# Patient Record
Sex: Female | Born: 1980 | Race: White | Hispanic: No | Marital: Married | Smoking: Never smoker
Health system: Southern US, Community
[De-identification: ages and names within clinical notes are randomized; demographics above are authoritative.]

## PROBLEM LIST (undated history)

## (undated) ENCOUNTER — Inpatient Hospital Stay (HOSPITAL_COMMUNITY): Payer: Self-pay

## (undated) DIAGNOSIS — K635 Polyp of colon: Secondary | ICD-10-CM

## (undated) DIAGNOSIS — E785 Hyperlipidemia, unspecified: Secondary | ICD-10-CM

## (undated) HISTORY — DX: Hyperlipidemia, unspecified: E78.5

## (undated) HISTORY — DX: Polyp of colon: K63.5

---

## 2002-02-04 HISTORY — PX: WISDOM TOOTH EXTRACTION: SHX21

## 2005-10-11 ENCOUNTER — Other Ambulatory Visit: Admission: RE | Admit: 2005-10-11 | Discharge: 2005-10-11 | Payer: Self-pay | Admitting: Family Medicine

## 2007-01-15 ENCOUNTER — Other Ambulatory Visit: Admission: RE | Admit: 2007-01-15 | Discharge: 2007-01-15 | Payer: Self-pay | Admitting: Family Medicine

## 2010-03-07 ENCOUNTER — Inpatient Hospital Stay (HOSPITAL_COMMUNITY): Admission: AD | Admit: 2010-03-07 | Payer: Self-pay | Admitting: Obstetrics & Gynecology

## 2010-04-02 ENCOUNTER — Inpatient Hospital Stay (HOSPITAL_COMMUNITY)
Admission: RE | Admit: 2010-04-02 | Discharge: 2010-04-05 | DRG: 373 | Disposition: A | Discharge status: Home / Self Care | Payer: BC Managed Care – PPO | Source: Ambulatory Visit | Attending: Obstetrics and Gynecology | Admitting: Obstetrics and Gynecology

## 2010-04-02 DIAGNOSIS — O99892 Other specified diseases and conditions complicating childbirth: Secondary | ICD-10-CM | POA: Diagnosis present

## 2010-04-02 DIAGNOSIS — D649 Anemia, unspecified: Secondary | ICD-10-CM | POA: Diagnosis not present

## 2010-04-02 DIAGNOSIS — O9903 Anemia complicating the puerperium: Secondary | ICD-10-CM | POA: Diagnosis not present

## 2010-04-02 DIAGNOSIS — Z22330 Carrier of Group B streptococcus: Secondary | ICD-10-CM

## 2010-04-02 LAB — CBC
HCT: 34.2 % — ABNORMAL LOW (ref 36.0–46.0)
Hemoglobin: 11.6 g/dL — ABNORMAL LOW (ref 12.0–15.0)
RBC: 3.73 MIL/uL — ABNORMAL LOW (ref 3.87–5.11)
RDW: 13 % (ref 11.5–15.5)
WBC: 14.4 10*3/uL — ABNORMAL HIGH (ref 4.0–10.5)

## 2010-04-04 LAB — CBC
HCT: 18.6 % — ABNORMAL LOW (ref 36.0–46.0)
Hemoglobin: 6.2 g/dL — CL (ref 12.0–15.0)
MCV: 93 fL (ref 78.0–100.0)
Platelets: 207 10*3/uL (ref 150–400)
RBC: 2 MIL/uL — ABNORMAL LOW (ref 3.87–5.11)
WBC: 18.9 10*3/uL — ABNORMAL HIGH (ref 4.0–10.5)

## 2010-04-05 ENCOUNTER — Inpatient Hospital Stay (HOSPITAL_COMMUNITY): Admission: AD | Admit: 2010-04-05 | Payer: Self-pay | Source: Ambulatory Visit | Admitting: Obstetrics and Gynecology

## 2010-04-05 LAB — CBC
HCT: 15.9 % — ABNORMAL LOW (ref 36.0–46.0)
Hemoglobin: 5.3 g/dL — CL (ref 12.0–15.0)
MCHC: 33.3 g/dL (ref 30.0–36.0)
MCV: 93.5 fL (ref 78.0–100.0)

## 2010-04-06 NOTE — Discharge Summary (Signed)
  [name], Carolyn Hill                  ACCOUNT NO.:  [id number]  MEDICAL RECORD NO.:  [id number]           PATIENT TYPE:  I  LOCATION:  9126                          FACILITY:  [facility]  PHYSICIAN:  Gerrit Friends. Aldona Bar, M.D.   DATE OF BIRTH:  01-30-1981  DATE OF ADMISSION:  04/02/2010 DATE OF DISCHARGE:  04/05/2010                              DISCHARGE SUMMARY   DISCHARGE DIAGNOSES: 1. A 41-week intrauterine pregnancy, delivered 7 pounds 9 ounces female     infant, Apgars 9 and 9. 2. Blood type O+. 3. Induction of labor. 4. Positive group B strep antenatally.  PROCEDURES: 1. Induction of labor. 2. Low forceps delivery. 3. Repair of third-degree tear.  SUMMARY:  The patient is a primigravida with a due date of March 24, 2010, who was admitted for induction on the morning of April 02, 2010.  Her group B strep was positive antenatally, and she was treated with intrapartum antibiotics.  She progressed and ultimately had low forceps delivery of a viable 7 pounds 9 ounces female infant with Apgars of 9 and 9 over a third-degree tear which was repaired without difficulty.  Forceps were used because she had difficulty with pushing and pushed for almost 3 hours.  Her postpartum course was uncomplicated with exception of anemia.  Her hemoglobin on the morning of April 05, 2010, was 5.3 with a white count of 13,900 and platelet count of 161,000.  In spite of her hemoglobin of 5.3, she was ambulating well, tolerating a regular diet well, having normal bowel and bladder function, was breast-feeding without difficulty, and was desirous of discharge.  Her uterus was firm.  She was having minimal bleeding.  She was given all appropriate instructions per discharge brochure and cautioned over a sudden changes in position because of her low hemoglobin.  Discharge medications include vitamins one a day as long she is breast-feeding, Feosol capsules with equivalent thereof - one daily, and she was given  prescriptions for Motrin 600 mg to use every 6 hours as needed for cramping or mild pain and Tylox one to two every 4-6 hours as needed for more severe pain.  She will return to the office for followup in approximately four weeks' time or as needed.  She was given an instruction discharge brochure at the time of discharge and understood all instructions well.  CONDITION ON DISCHARGE:  Improved.    Gerrit Friends. Aldona Bar, M.D.    RMW/MEDQ  D:  04/05/2010  T:  04/05/2010  Job:  [id number]  Electronically Signed by Annamaria Helling M.D. on 04/06/2010 09:13:40 AM

## 2012-11-04 LAB — OB RESULTS CONSOLE ABO/RH: RH TYPE: POSITIVE

## 2012-11-04 LAB — OB RESULTS CONSOLE GC/CHLAMYDIA
CHLAMYDIA, DNA PROBE: NEGATIVE
Gonorrhea: NEGATIVE

## 2012-11-04 LAB — OB RESULTS CONSOLE HEPATITIS B SURFACE ANTIGEN: HEP B S AG: NEGATIVE

## 2012-11-04 LAB — OB RESULTS CONSOLE HIV ANTIBODY (ROUTINE TESTING): HIV: NONREACTIVE

## 2012-11-04 LAB — OB RESULTS CONSOLE ANTIBODY SCREEN: ANTIBODY SCREEN: NEGATIVE

## 2012-11-04 LAB — OB RESULTS CONSOLE RUBELLA ANTIBODY, IGM: Rubella: IMMUNE

## 2012-11-04 LAB — OB RESULTS CONSOLE RPR: RPR: NONREACTIVE

## 2012-11-17 ENCOUNTER — Other Ambulatory Visit (HOSPITAL_COMMUNITY): Payer: Self-pay | Admitting: Obstetrics and Gynecology

## 2012-11-17 ENCOUNTER — Ambulatory Visit (HOSPITAL_COMMUNITY)
Admission: RE | Admit: 2012-11-17 | Discharge: 2012-11-17 | Disposition: A | Discharge status: Home / Self Care | Payer: BC Managed Care – PPO | Source: Ambulatory Visit | Attending: Obstetrics and Gynecology | Admitting: Obstetrics and Gynecology

## 2012-11-17 DIAGNOSIS — O200 Threatened abortion: Secondary | ICD-10-CM

## 2013-02-04 NOTE — L&D Delivery Note (Addendum)
Delivery Note At 7:02 AM a viable and healthy female was delivered via Vaginal, Spontaneous Delivery (Presentation: Left Occiput Anterior).  APGAR: 9, 9; weight pending .   Placenta status: Intact, Spontaneous.  Cord: 3 vessels with a loose nuchal  Pt delivered the infants head with 2 contractions. THe body quickly followed and the infant was passed to the waiting maternal abdomen. Placenta delivered spontaneously. Pt had a large gush of blood and the uterus was boggy. Methergine was called for. After placental delivery the patient began feeling nauseated, lightheaded, and dizzy. Her BP was 65/35 c/w a vasovagal reaction. Pt received methergine and some phenylephrine. She vomited adn her BP improved and she felt better. A second degree laceration was repaired with 3-0 vicryl  Anesthesia: Epidural  Episiotomy: None Lacerations: 2nd degree Suture Repair: 3.0 vicryl Est. Blood Loss (mL): 500  Mom to postpartum.  Baby to Couplet care / Skin to Skin.  Freddrick MarchKendra H. Gunter Conde 06/18/2013, 7:55 AM

## 2013-05-19 LAB — OB RESULTS CONSOLE GBS: STREP GROUP B AG: NEGATIVE

## 2013-05-27 ENCOUNTER — Encounter (HOSPITAL_COMMUNITY): Payer: Self-pay | Admitting: General Practice

## 2013-05-27 ENCOUNTER — Inpatient Hospital Stay (HOSPITAL_COMMUNITY): Payer: BC Managed Care – PPO

## 2013-05-27 ENCOUNTER — Inpatient Hospital Stay (HOSPITAL_COMMUNITY)
Admission: AD | Admit: 2013-05-27 | Discharge: 2013-05-27 | Disposition: A | Discharge status: Home / Self Care | Payer: BC Managed Care – PPO | Source: Ambulatory Visit | Attending: Obstetrics & Gynecology | Admitting: Obstetrics & Gynecology

## 2013-05-27 DIAGNOSIS — O368390 Maternal care for abnormalities of the fetal heart rate or rhythm, unspecified trimester, not applicable or unspecified: Secondary | ICD-10-CM | POA: Diagnosis not present

## 2013-05-27 DIAGNOSIS — IMO0002 Reserved for concepts with insufficient information to code with codable children: Secondary | ICD-10-CM

## 2013-05-27 NOTE — MAU Note (Signed)
Pt was sent from office for prolonged monitoring due to audible decels in the office.

## 2013-05-27 NOTE — MAU Provider Note (Signed)
  History     CSN: [id number]  Arrival date and time: 05/27/13 1630   None     Chief Complaint  Patient presents with  . audible decels/variables from office    HPI This is a 33 y.o. female at 4938w3d who presents with audible decels/low FHR in office. Sent here for EFM.  RN Note:   Pt was sent from office for prolonged monitoring due to audible decels in the office.       OB History   Grav Para Term Preterm Abortions TAB SAB Ect Mult Living   2 1 1       1       History reviewed. No pertinent past medical history.  No past surgical history on file.  No family history on file.  History  Substance Use Topics  . Smoking status: Not on file  . Smokeless tobacco: Not on file  . Alcohol Use: Not on file    Allergies: Not on File  No prescriptions prior to admission    Review of Systems  Constitutional: Negative for fever, chills and malaise/fatigue.  Gastrointestinal: Negative for nausea, vomiting and abdominal pain.  Neurological: Negative for headaches.   Physical Exam   Blood pressure 108/55, pulse 75, temperature 98.5 F (36.9 C), temperature source Oral, resp. rate 18, height 5\' 6"  (1.676 m), weight 78.926 kg (174 lb).  Physical Exam  Constitutional: She is oriented to person, place, and time. She appears well-developed and well-nourished. No distress.  Cardiovascular: Normal rate.   Respiratory: Effort normal.  GI: Soft. There is no tenderness.  Genitourinary:  FHR baseline at 105bpm Good variability Maternal HR 80s Two accels to 140 in first 10 min  Musculoskeletal: Normal range of motion.  Neurological: She is alert and oriented to person, place, and time.  Skin: Skin is warm and dry.  Psychiatric: She has a normal mood and affect.    MAU Course  Procedures  MDM Reviewed initial FHR tracing with Dr Mora ApplPinn. Will check BPP and continue to monitor  BPP 8/8, AFI 24.59, increased FHR baseline after BPP is 110.  Assessment and Plan  A:  SIUP  at 6343w4d        Reactive Nonstress test with low baseline FHR       Normal Fetal BPP  P:  Discussed with Dr Mora ApplPinn       Discharge home       Kick counts  Aviva SignsMarie L Gorden Stthomas 05/27/2013, 5:07 PM

## 2013-05-27 NOTE — Discharge Instructions (Signed)
Fetal Biophysical Profile °This is a test that measures five different variables of the fetus: Heart rate, breathing movement, total movement of the baby, fetal muscle tone, the amount of amniotic fluid, and the heart rate activity of the fetus. The five variables are measured individually and contribute either a 2 or a 0 to the overall scoring of the test. The measurements are as follows: °· Fetal heart rate activity. This is measured and scored in the same way as a non-stress test. The fetal heart rate is considered reactive when there are movement-associated fetal heart rate increases of at least 15 beats per minute above baseline, and 15 seconds in duration over a 20-minute period. A score of 2 is given for reactivity, and a score of 0 indicates that the fetal heart rate is non-reactive. °· Fetal breathing movements. This is scored based on fetal breathing movements and indicate fetal well-being. Their absence may indicate a low oxygen level for the fetus. Fetal breathing increases in frequency and uniformity after the 36th week of pregnancy. To earn a score of 2, the fetus must have at least one episode of fetal breathing lasting at least 60 seconds within a 30-minute observation. Absence of this breathing is scored a 0 on the BPP. °· Fetal body movements. Fetal activity is a reflection of brain integrity and function. The presence of at least three episodes of fetal movements within a 30-minute period is given a score of 2. A score of 0 is given with two or less movements in this time period. Fetal activity is highest 1 to 3 hours after the mother has eaten a meal. °· Fetal tone. In the uterus, the fetus is normally in a position of flexion. This means the head is bent down towards the knees. The fetus also stretches, rolls, and moves in the uterus. The arms, legs, trunk, and head may be flexed and extended. A score of 2 is earned when there is at least one episode of active extension with return flexion. A  score of 0 is given for slow extension with a return to only partial flexion. Fetal movement not followed by return to flexion, limbs or spine in extension, and an open fetal hand score 0. °· Amniotic fluid volume. Amniotic fluid volume has been demonstrated to be a good method of predicting fetal distress. Too little amniotic fluid has been associated with fetal abnormalities, slow uterine growth, and over due pregnancy. A score of 2 is given for this when there is at least one pocket of amniotic fluid that measures 1 cm in a specific area. A score of 0 indicates either that fluid is absent in most areas of the uterine cavity or that the largest pocket of fluid measures less than 1 cm. °PREPARATION FOR TEST °No preparation or fasting is necessary. °NORMAL FINDINGS °· A score of 8-10 points (if amniotic fluid volume is adequate). °· Possible critical values: Less than 4 may necessitate immediate delivery of fetus. °Ranges for normal findings may vary among different laboratories and hospitals. You should always check with your doctor after having lab work or other tests done to discuss the meaning of your test results and whether your values are considered within normal limits. °MEANING OF TEST  °Your caregiver will go over the test results with you and discuss the importance and meaning of your results, as well as treatment options and the need for additional tests if necessary. °OBTAINING THE TEST RESULTS  °It is your responsibility to obtain your test   results. Ask the lab or department performing the test when and how you will get your results. °Document Released: 05/24/2004 Document Revised: 04/15/2011 Document Reviewed: 01/01/2008 °[employer]® Patient Information ©2014 [employer]. ° °

## 2013-05-30 NOTE — MAU Provider Note (Signed)
I agree with above CNM note.  Carolyn DienerWalda Chynna Hill

## 2013-06-14 ENCOUNTER — Telehealth (HOSPITAL_COMMUNITY): Payer: Self-pay | Admitting: *Deleted

## 2013-06-14 ENCOUNTER — Encounter (HOSPITAL_COMMUNITY): Payer: Self-pay | Admitting: *Deleted

## 2013-06-14 NOTE — Telephone Encounter (Signed)
Preadmission screen  

## 2013-06-17 ENCOUNTER — Encounter (HOSPITAL_COMMUNITY): Payer: Self-pay | Admitting: *Deleted

## 2013-06-17 ENCOUNTER — Encounter (HOSPITAL_COMMUNITY): Payer: BC Managed Care – PPO | Admitting: Anesthesiology

## 2013-06-17 ENCOUNTER — Inpatient Hospital Stay (HOSPITAL_COMMUNITY)
Admission: AD | Admit: 2013-06-17 | Discharge: 2013-06-20 | DRG: 775 | Disposition: A | Discharge status: Home / Self Care | Payer: BC Managed Care – PPO | Source: Ambulatory Visit | Attending: Obstetrics and Gynecology | Admitting: Obstetrics and Gynecology

## 2013-06-17 ENCOUNTER — Inpatient Hospital Stay (HOSPITAL_COMMUNITY): Payer: BC Managed Care – PPO | Admitting: Anesthesiology

## 2013-06-17 DIAGNOSIS — O480 Post-term pregnancy: Secondary | ICD-10-CM | POA: Diagnosis present

## 2013-06-17 LAB — CBC
HCT: 35.5 % — ABNORMAL LOW (ref 36.0–46.0)
Hemoglobin: 12.4 g/dL (ref 12.0–15.0)
MCH: 31.6 pg (ref 26.0–34.0)
MCHC: 34.9 g/dL (ref 30.0–36.0)
MCV: 90.6 fL (ref 78.0–100.0)
Platelets: 198 10*3/uL (ref 150–400)
RBC: 3.92 MIL/uL (ref 3.87–5.11)
RDW: 13.2 % (ref 11.5–15.5)
WBC: 12.5 10*3/uL — ABNORMAL HIGH (ref 4.0–10.5)

## 2013-06-17 LAB — TYPE AND SCREEN
ABO/RH(D): O POS
ANTIBODY SCREEN: NEGATIVE

## 2013-06-17 LAB — ABO/RH: ABO/RH(D): O POS

## 2013-06-17 MED ORDER — FENTANYL 2.5 MCG/ML BUPIVACAINE 1/10 % EPIDURAL INFUSION (WH - ANES): 14.0000 mL/h | INTRAMUSCULAR | Status: DC | PRN

## 2013-06-17 MED ORDER — EPHEDRINE 5 MG/ML INJ
10.0000 mg | INTRAVENOUS | Status: DC | PRN
  Filled 2013-06-17: qty 2

## 2013-06-17 MED ORDER — FENTANYL 2.5 MCG/ML BUPIVACAINE 1/10 % EPIDURAL INFUSION (WH - ANES)
14.0000 mL/h | INTRAMUSCULAR | Status: DC | PRN
  Administered 2013-06-18: 14 mL/h via EPIDURAL
  Filled 2013-06-17 (×2): qty 125

## 2013-06-17 MED ORDER — PHENYLEPHRINE 40 MCG/ML (10ML) SYRINGE FOR IV PUSH (FOR BLOOD PRESSURE SUPPORT)
80.0000 ug | PREFILLED_SYRINGE | INTRAVENOUS | Status: DC | PRN
  Filled 2013-06-17: qty 2
  Filled 2013-06-17: qty 10

## 2013-06-17 MED ORDER — ONDANSETRON HCL 4 MG/2ML IJ SOLN
4.0000 mg | Freq: Four times a day (QID) | INTRAMUSCULAR | Status: DC | PRN
  Administered 2013-06-18: 4 mg via INTRAVENOUS
  Filled 2013-06-17: qty 2

## 2013-06-17 MED ORDER — OXYTOCIN BOLUS FROM INFUSION: 500.0000 mL | INTRAVENOUS | Status: DC

## 2013-06-17 MED ORDER — IBUPROFEN 600 MG PO TABS: 600.0000 mg | ORAL_TABLET | Freq: Four times a day (QID) | ORAL | Status: DC | PRN

## 2013-06-17 MED ORDER — BUTORPHANOL TARTRATE 1 MG/ML IJ SOLN: 1.0000 mg | INTRAMUSCULAR | Status: DC | PRN

## 2013-06-17 MED ORDER — CITRIC ACID-SODIUM CITRATE 334-500 MG/5ML PO SOLN: 30.0000 mL | ORAL | Status: DC | PRN

## 2013-06-17 MED ORDER — TERBUTALINE SULFATE 1 MG/ML IJ SOLN: 0.2500 mg | Freq: Once | INTRAMUSCULAR | Status: AC | PRN

## 2013-06-17 MED ORDER — DIPHENHYDRAMINE HCL 50 MG/ML IJ SOLN: 12.5000 mg | INTRAMUSCULAR | Status: DC | PRN

## 2013-06-17 MED ORDER — FENTANYL 2.5 MCG/ML BUPIVACAINE 1/10 % EPIDURAL INFUSION (WH - ANES)
INTRAMUSCULAR | Status: DC | PRN
  Administered 2013-06-17: 14 mL/h via EPIDURAL

## 2013-06-17 MED ORDER — OXYTOCIN 40 UNITS IN LACTATED RINGERS INFUSION - SIMPLE MED
1.0000 m[IU]/min | INTRAVENOUS | Status: DC
  Administered 2013-06-17: 2 m[IU]/min via INTRAVENOUS
  Filled 2013-06-17: qty 1000

## 2013-06-17 MED ORDER — ACETAMINOPHEN 325 MG PO TABS
650.0000 mg | ORAL_TABLET | ORAL | Status: DC | PRN
Start: 2013-06-17 — End: 2013-06-18

## 2013-06-17 MED ORDER — LACTATED RINGERS IV SOLN
500.0000 mL | INTRAVENOUS | Status: DC | PRN
  Administered 2013-06-18: 500 mL via INTRAVENOUS

## 2013-06-17 MED ORDER — LIDOCAINE HCL (PF) 1 % IJ SOLN
30.0000 mL | INTRAMUSCULAR | Status: DC | PRN
  Filled 2013-06-17: qty 30

## 2013-06-17 MED ORDER — OXYTOCIN 40 UNITS IN LACTATED RINGERS INFUSION - SIMPLE MED: 62.5000 mL/h | INTRAVENOUS | Status: DC

## 2013-06-17 MED ORDER — LACTATED RINGERS IV SOLN
500.0000 mL | Freq: Once | INTRAVENOUS | Status: AC
  Administered 2013-06-17: 500 mL via INTRAVENOUS

## 2013-06-17 MED ORDER — LIDOCAINE HCL (PF) 1 % IJ SOLN
INTRAMUSCULAR | Status: DC | PRN
  Administered 2013-06-17 (×2): 4 mL

## 2013-06-17 MED ORDER — PHENYLEPHRINE 40 MCG/ML (10ML) SYRINGE FOR IV PUSH (FOR BLOOD PRESSURE SUPPORT)
80.0000 ug | PREFILLED_SYRINGE | INTRAVENOUS | Status: DC | PRN
  Administered 2013-06-18: 80 ug via INTRAVENOUS
  Filled 2013-06-17: qty 2

## 2013-06-17 MED ORDER — EPHEDRINE 5 MG/ML INJ
10.0000 mg | INTRAVENOUS | Status: DC | PRN
  Filled 2013-06-17: qty 2
  Filled 2013-06-17: qty 4

## 2013-06-17 MED ORDER — ZOLPIDEM TARTRATE 5 MG PO TABS
5.0000 mg | ORAL_TABLET | Freq: Every evening | ORAL | Status: DC | PRN
Start: 2013-06-17 — End: 2013-06-18

## 2013-06-17 MED ORDER — LACTATED RINGERS IV SOLN
INTRAVENOUS | Status: DC
  Administered 2013-06-17 – 2013-06-18 (×4): via INTRAVENOUS

## 2013-06-17 MED ORDER — OXYCODONE-ACETAMINOPHEN 5-325 MG PO TABS: 1.0000 | ORAL_TABLET | ORAL | Status: DC | PRN

## 2013-06-17 NOTE — Anesthesia Procedure Notes (Addendum)
Epidural Patient location during procedure: OB Start time: 06/17/2013 9:37 PM End time: 06/17/2013 9:50 PM  Staffing Anesthesiologist: Lewie LoronGERMEROTH, Cammie Faulstich R Performed by: anesthesiologist   Preanesthetic Checklist Completed: patient identified, pre-op evaluation, timeout performed, IV checked, risks and benefits discussed and monitors and equipment checked  Epidural Patient position: sitting Prep: site prepped and draped and DuraPrep Patient monitoring: heart rate Approach: midline Location: L2-L3 Injection technique: LOR air and LOR saline  Needle:  Needle type: Tuohy  Needle gauge: 17 G Needle length: 9 cm Needle insertion depth: 5 cm Catheter type: closed end flexible Catheter size: 19 Gauge Catheter at skin depth: 11 cm Test dose: negative  Assessment Sensory level: T8 Events: blood not aspirated, injection not painful, no injection resistance, negative IV test and no paresthesia  Additional Notes Reason for block:procedure for pain

## 2013-06-17 NOTE — Anesthesia Preprocedure Evaluation (Signed)
Anesthesia Evaluation  Patient identified by MRN, date of birth, ID band Patient awake    Reviewed: Allergy & Precautions, H&P , NPO status , Patient's Chart, lab work & pertinent test results  Airway Mallampati: II TM Distance: >3 FB Neck ROM: Full    Dental   Pulmonary neg pulmonary ROS,          Cardiovascular negative cardio ROS  Rhythm:Regular     Neuro/Psych negative neurological ROS  negative psych ROS   GI/Hepatic negative GI ROS, Neg liver ROS,   Endo/Other  negative endocrine ROS  Renal/GU negative Renal ROS     Musculoskeletal negative musculoskeletal ROS (+)   Abdominal   Peds  Hematology negative hematology ROS (+)   Anesthesia Other Findings   Reproductive/Obstetrics (+) Pregnancy                           Anesthesia Physical Anesthesia Plan  ASA: II  Anesthesia Plan: Epidural   Post-op Pain Management:    Induction:   Airway Management Planned:   Additional Equipment:   Intra-op Plan:   Post-operative Plan:   Informed Consent: I have reviewed the patients History and Physical, chart, labs and discussed the procedure including the risks, benefits and alternatives for the proposed anesthesia with the patient or authorized representative who has indicated his/her understanding and acceptance.     Plan Discussed with:   Anesthesia Plan Comments:         Anesthesia Quick Evaluation

## 2013-06-17 NOTE — MAU Note (Addendum)
PT SAYS SHE WENT TO OFFICE  TODAY-  FOR REG  APPOINTMENT -- HAD NST IN OFFICE -  HAD LOW BASE LINE-     DENIES UC,    VE- DR ROSS-  3 CM.   DENIES HSV AND MRSA.  SAYS HAS SORE THROAT -  FEELS LIKE  MAY BE GETTING   A ' cold '

## 2013-06-18 ENCOUNTER — Encounter (HOSPITAL_COMMUNITY): Payer: Self-pay | Admitting: Obstetrics

## 2013-06-18 LAB — RPR

## 2013-06-18 MED ORDER — ONDANSETRON HCL 4 MG/2ML IJ SOLN: 4.0000 mg | INTRAMUSCULAR | Status: DC | PRN

## 2013-06-18 MED ORDER — METHYLERGONOVINE MALEATE 0.2 MG/ML IJ SOLN: 0.2000 mg | INTRAMUSCULAR | Status: DC | PRN

## 2013-06-18 MED ORDER — DIPHENHYDRAMINE HCL 25 MG PO CAPS: 25.0000 mg | ORAL_CAPSULE | Freq: Four times a day (QID) | ORAL | Status: DC | PRN

## 2013-06-18 MED ORDER — METHYLERGONOVINE MALEATE 0.2 MG/ML IJ SOLN
INTRAMUSCULAR | Status: AC
  Administered 2013-06-18: 0.2 mg via INTRAMUSCULAR
  Filled 2013-06-18: qty 1

## 2013-06-18 MED ORDER — ONDANSETRON HCL 4 MG PO TABS: 4.0000 mg | ORAL_TABLET | ORAL | Status: DC | PRN

## 2013-06-18 MED ORDER — BENZOCAINE-MENTHOL 20-0.5 % EX AERO
1.0000 "application " | INHALATION_SPRAY | CUTANEOUS | Status: DC | PRN
  Filled 2013-06-18: qty 56

## 2013-06-18 MED ORDER — PRENATAL MULTIVITAMIN CH
1.0000 | ORAL_TABLET | Freq: Every day | ORAL | Status: DC
  Administered 2013-06-18 – 2013-06-20 (×2): 1 via ORAL
  Filled 2013-06-18 (×3): qty 1

## 2013-06-18 MED ORDER — LANOLIN HYDROUS EX OINT: TOPICAL_OINTMENT | CUTANEOUS | Status: DC | PRN

## 2013-06-18 MED ORDER — SENNOSIDES-DOCUSATE SODIUM 8.6-50 MG PO TABS
2.0000 | ORAL_TABLET | ORAL | Status: DC
  Administered 2013-06-18 – 2013-06-20 (×2): 2 via ORAL
  Filled 2013-06-18 (×2): qty 2

## 2013-06-18 MED ORDER — DIBUCAINE 1 % RE OINT: 1.0000 "application " | TOPICAL_OINTMENT | RECTAL | Status: DC | PRN

## 2013-06-18 MED ORDER — OXYCODONE-ACETAMINOPHEN 5-325 MG PO TABS
1.0000 | ORAL_TABLET | ORAL | Status: DC | PRN
  Administered 2013-06-18 – 2013-06-19 (×2): 1 via ORAL
  Filled 2013-06-18 (×2): qty 1

## 2013-06-18 MED ORDER — METHYLERGONOVINE MALEATE 0.2 MG/ML IJ SOLN
0.2000 mg | Freq: Once | INTRAMUSCULAR | Status: AC
  Administered 2013-06-18: 0.2 mg via INTRAMUSCULAR

## 2013-06-18 MED ORDER — METHYLERGONOVINE MALEATE 0.2 MG PO TABS: 0.2000 mg | ORAL_TABLET | ORAL | Status: DC | PRN

## 2013-06-18 MED ORDER — IBUPROFEN 600 MG PO TABS
600.0000 mg | ORAL_TABLET | Freq: Four times a day (QID) | ORAL | Status: DC
  Administered 2013-06-18 – 2013-06-20 (×8): 600 mg via ORAL
  Filled 2013-06-18 (×8): qty 1

## 2013-06-18 MED ORDER — TETANUS-DIPHTH-ACELL PERTUSSIS 5-2.5-18.5 LF-MCG/0.5 IM SUSP
0.5000 mL | Freq: Once | INTRAMUSCULAR | Status: AC
  Administered 2013-06-20: 0.5 mL via INTRAMUSCULAR

## 2013-06-18 MED ORDER — WITCH HAZEL-GLYCERIN EX PADS: 1.0000 "application " | MEDICATED_PAD | CUTANEOUS | Status: DC | PRN

## 2013-06-18 MED ORDER — ZOLPIDEM TARTRATE 5 MG PO TABS: 5.0000 mg | ORAL_TABLET | Freq: Every evening | ORAL | Status: DC | PRN

## 2013-06-18 MED ORDER — SIMETHICONE 80 MG PO CHEW: 80.0000 mg | CHEWABLE_TABLET | ORAL | Status: DC | PRN

## 2013-06-18 NOTE — Transfer of Care (Signed)
Anesthesia Post Note  Patient: Carolyn Hill  Procedure(s) Performed: * No procedures listed *  Anesthesia type: Epidural  Patient location: Mother/Baby  Post pain: Pain level controlled  Post assessment: Post-op Vital signs reviewed  Last Vitals:  Filed Vitals:   06/18/13 1408  BP: 91/60  Pulse: 71  Temp: 36.6 C  Resp: 20    Post vital signs: Reviewed  Level of consciousness:alert  Complications: No apparent anesthesia complications

## 2013-06-18 NOTE — Progress Notes (Signed)
Patient ID: Carolyn Hill, female   DOB: 02/24/1980, 33 y.o.   MRN: [id number]  CTSP: Pt received epidural and then baseline seemed to decline to 100s. Pt had some variables with contractions. Moderate variability, with accelerations. + scalp stim. FSE applied, again + accels.  Pt rotated to right and then left and noted to have decelerations to 70-80s. Pt repositioned to sitting up-right and FHT returned to 100s with 10X10 accels. Pitocin turned off FHR now 100-110s with accelerations, no decelerations, category 1 tracing cvx 5/80/-2 toco Q 4 minutes A/P FWB reassuring Will restart pit after being off x 30 minutes

## 2013-06-18 NOTE — Lactation Note (Signed)
This note was copied from the chart of Carolyn Lisabeth RegisterCary Skillin. Lactation Consultation Note: experienced BF mom. Assisted with latch- nursing well. This is 3rd feeding since birth. Reviewed feeding cues and encouraged to feed whenever she sees them. No questions at present. BF brochure given with resources for support after DC. To call prn  Patient Name: Carolyn Hill UJWJX'BToday's Date: 06/18/2013 Reason for consult: Initial assessment   Maternal Data Formula Feeding for Exclusion: No Infant to breast within first hour of birth: Yes Does the patient have breastfeeding experience prior to this delivery?: Yes  Feeding Feeding Type: Breast Fed Length of feed: 10 min  LATCH Score/Interventions Latch: Grasps breast easily, tongue down, lips flanged, rhythmical sucking.  Audible Swallowing: A few with stimulation  Type of Nipple: Everted at rest and after stimulation  Comfort (Breast/Nipple): Soft / non-tender     Hold (Positioning): Assistance needed to correctly position infant at breast and maintain latch. Intervention(s): Breastfeeding basics reviewed;Support Pillows  LATCH Score: 8  Lactation Tools Discussed/Used     Consult Status Consult Status: Follow-up Date: 06/19/13 Follow-up type: In-patient    [name] 06/18/2013, 12:16 PM

## 2013-06-19 LAB — CBC
HCT: 24.7 % — ABNORMAL LOW (ref 36.0–46.0)
Hemoglobin: 8.4 g/dL — ABNORMAL LOW (ref 12.0–15.0)
MCH: 31.1 pg (ref 26.0–34.0)
MCHC: 33.6 g/dL (ref 30.0–36.0)
MCV: 92.5 fL (ref 78.0–100.0)
PLATELETS: 178 10*3/uL (ref 150–400)
RBC: 2.67 MIL/uL — AB (ref 3.87–5.11)
RDW: 13.6 % (ref 11.5–15.5)
WBC: 17.7 10*3/uL — AB (ref 4.0–10.5)

## 2013-06-19 NOTE — Lactation Note (Signed)
This note was copied from the chart of Carolyn Lisabeth RegisterCary Ragin. Lactation Consultation Note Follow up visit at 34 hours, baby observed with good latch prior to coming off after a 40 minute feeding.  Mom complains of soreness nipple is round after feeding, but pink.  Comfort gels given with instructions.  Encouraged EBM to nipples and to let air dry before applying comfort gels.  Last stool was 2140 last pm, discussed cluster feeding and output expectations.  Baby has has 3 stools over the past 24 hours with 4 voids.  [facility]     Patient Name: Carolyn Hill WUJWJ'XToday's Date: 06/19/2013 Reason for consult: Follow-up assessment   Maternal Data    Feeding Feeding Type: Breast Fed Length of feed: 40 min  LATCH Score/Interventions Latch: Grasps breast easily, tongue down, lips flanged, rhythmical sucking.  Audible Swallowing: Spontaneous and intermittent  Type of Nipple: Everted at rest and after stimulation  Comfort (Breast/Nipple): Soft / non-tender     Hold (Positioning): No assistance needed to correctly position infant at breast. Intervention(s): Breastfeeding basics reviewed;Support Pillows  LATCH Score: 10  Lactation Tools Discussed/Used Tools: Comfort gels   Consult Status Consult Status: Follow-up Date: 06/20/13 Follow-up type: In-patient    [name] 06/19/2013, 5:26 PM

## 2013-06-19 NOTE — Progress Notes (Signed)
PPD #1 Patient is eating, ambulating, voiding.  Pain control is good.  Appropriate lochia.  No complaints.  Would like to stay until tomorrow.  Filed Vitals:   06/18/13 1123 06/18/13 1408 06/18/13 2219 06/19/13 0530  BP: 99/66 91/60 90/43  102/65  Pulse: 79 71 65 62  Temp: 98.5 F (36.9 C) 97.8 F (36.6 C) 98.2 F (36.8 C) 97.7 F (36.5 C)  TempSrc: Oral Oral Oral Oral  Resp: 18 20 16 18   Height:      Weight:      SpO2:        Fundus firm Perineum without swelling. No CT  Lab Results  Component Value Date   WBC 17.7* 06/19/2013   HGB 8.4* 06/19/2013   HCT 24.7* 06/19/2013   MCV 92.5 06/19/2013   PLT 178 06/19/2013    --/--/O POS, O POS (05/14 1835)  A/P Post partum day 1.  Routine care.  Expect d/c 5/17. Circ done    Yahoo! IncSidney Shley Dolby

## 2013-06-20 NOTE — Lactation Note (Signed)
This note was copied from the chart of [name]. Lactation Consultation Note; Mom is bathroom, dad putting baby in car seat. She reports that baby has been nursing alot and her nipples are sore. Offered assist but mom states several people have seen the latch. No questions at present. To call prn.  Patient Name: [name] EAVWU'JToday's Date: 06/20/2013 Reason for consult: Follow-up assessment   Maternal Data    Feeding Feeding Type: Breast Fed Length of feed: 5 min  LATCH Score/Interventions          Comfort (Breast/Nipple): Filling, red/small blisters or bruises, mild/mod discomfort  Problem noted: Mild/Moderate discomfort Interventions (Mild/moderate discomfort): Comfort gels        Lactation Tools Discussed/Used     Consult Status Consult Status: Complete    Pamelia HoitDonna D Myrical Andujo 06/20/2013, 11:30 AM

## 2013-06-20 NOTE — Discharge Summary (Signed)
Obstetric Discharge Summary Reason for Admission: onset of labor Prenatal Procedures: ultrasound Intrapartum Procedures: spontaneous vaginal delivery Postpartum Procedures: none Complications-Operative and Postpartum: none Hemoglobin  Date Value Ref Range Status  06/19/2013 8.4* 12.0 - 15.0 g/dL Final     DELTA CHECK NOTED     REPEATED TO VERIFY     HCT  Date Value Ref Range Status  06/19/2013 24.7* 36.0 - 46.0 % Final    Physical Exam:  General: alert and cooperative Lochia: appropriate Uterine Fundus: firm DVT Evaluation: No evidence of DVT seen on physical exam.  Discharge Diagnoses: Term Pregnancy-delivered  Discharge Information: Date: 06/20/2013 Activity: pelvic rest Diet: routine Medications: PNV, Ibuprofen, Colace and Iron Condition: stable Instructions: refer to practice specific booklet Discharge to: home Follow-up Information   Follow up with [name],[name], MD In 4 weeks.   Specialty:  Obstetrics and Gynecology   Contact information:   [address] [place] [address] [phone]       Newborn Data: Live born female  Birth Weight: 8 lb 7 oz (3827 g) APGAR: 9, 9  Home with mother.  Carolyn AspenSidney Anmol Hill 06/20/2013, 8:59 AM

## 2013-06-21 ENCOUNTER — Inpatient Hospital Stay (HOSPITAL_COMMUNITY): Admission: RE | Admit: 2013-06-21 | Payer: BC Managed Care – PPO | Source: Ambulatory Visit

## 2013-06-24 NOTE — H&P (Signed)
Carolyn RegisterCary Hill is a 33 y.o. female presenting for IOL for postdates and Non-reassuring fetal tracing in office  33 yo G2P1001 @ 40+5 presents from the office for IOL due to a non-reactive fetal tracing in the office. Given her post-term status and questionable tracing the recommendation was for IOL. Her cervix was 3/60/-2 in the office. History OB History   Grav Para Term Preterm Abortions TAB SAB Ect Mult Living   2 2 2       2      History reviewed. No pertinent past medical history. Past Surgical History  Procedure Laterality Date  . Wisdom tooth extraction  2004   Family History: family history is not on file. Social History:  reports that she has never smoked. She has never used smokeless tobacco. She reports that she does not drink alcohol or use illicit drugs.   Prenatal Transfer Tool  Maternal Diabetes: None Genetic Screening: declined Maternal Ultrasounds/Referrals: Normal Fetal Ultrasounds or other Referrals:  None Maternal Substance Abuse:  No Significant Maternal Medications:  None Significant Maternal Lab Results:  None Other Comments:  None  ROS: as above  Dilation: 10 Effacement (%): 100 Station: +2 Exam by:: K Fraley RN Blood pressure 106/62, pulse 71, temperature 97.6 F (36.4 C), temperature source Oral, resp. rate 19, height 5\' 6"  (1.676 m), weight 79.833 kg (176 lb), SpO2 99.00%, unknown if currently breastfeeding. Exam Physical Exam  Prenatal labs: ABO, Rh: --/--/O POS, O POS (05/14 1835) Antibody: NEG (05/14 1835) Rubella: Immune (10/01 0000) RPR: NON REAC (05/14 1835)  HBsAg: Negative (10/01 0000)  HIV: Non-reactive (10/01 0000)  GBS: Negative (04/15 0000)   Assessment/Plan: 1) admit 2) AROM/Pit 3) Epidural on request 4) Anticipate svd   Carolyn Hill H. Carolyn Hill 06/24/2013, 7:19 PM

## 2013-12-06 ENCOUNTER — Encounter (HOSPITAL_COMMUNITY): Payer: Self-pay | Admitting: Obstetrics
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
## 2015-11-30 LAB — OB RESULTS CONSOLE HIV ANTIBODY (ROUTINE TESTING): HIV: NONREACTIVE

## 2015-11-30 LAB — OB RESULTS CONSOLE RPR: RPR: NONREACTIVE

## 2015-11-30 LAB — OB RESULTS CONSOLE ABO/RH: RH Type: POSITIVE

## 2015-11-30 LAB — OB RESULTS CONSOLE GC/CHLAMYDIA
CHLAMYDIA, DNA PROBE: NEGATIVE
Gonorrhea: NEGATIVE

## 2015-11-30 LAB — OB RESULTS CONSOLE HEPATITIS B SURFACE ANTIGEN: HEP B S AG: NEGATIVE

## 2015-11-30 LAB — OB RESULTS CONSOLE RUBELLA ANTIBODY, IGM: RUBELLA: IMMUNE

## 2015-11-30 LAB — OB RESULTS CONSOLE ANTIBODY SCREEN: Antibody Screen: NEGATIVE

## 2015-12-13 ENCOUNTER — Encounter (HOSPITAL_COMMUNITY): Payer: Self-pay | Admitting: Obstetrics and Gynecology

## 2015-12-13 ENCOUNTER — Other Ambulatory Visit (HOSPITAL_COMMUNITY): Payer: Self-pay | Admitting: Obstetrics and Gynecology

## 2015-12-13 DIAGNOSIS — O9989 Other specified diseases and conditions complicating pregnancy, childbirth and the puerperium: Principal | ICD-10-CM

## 2015-12-13 DIAGNOSIS — Z20828 Contact with and (suspected) exposure to other viral communicable diseases: Secondary | ICD-10-CM

## 2015-12-13 DIAGNOSIS — B178 Other specified acute viral hepatitis: Secondary | ICD-10-CM

## 2015-12-13 DIAGNOSIS — Z3689 Encounter for other specified antenatal screening: Secondary | ICD-10-CM

## 2015-12-13 DIAGNOSIS — B2709 Gammaherpesviral mononucleosis with other complications: Secondary | ICD-10-CM

## 2015-12-13 DIAGNOSIS — Z3A18 18 weeks gestation of pregnancy: Secondary | ICD-10-CM

## 2015-12-15 ENCOUNTER — Encounter (HOSPITAL_COMMUNITY): Payer: Self-pay | Admitting: Obstetrics and Gynecology

## 2016-01-02 ENCOUNTER — Ambulatory Visit (HOSPITAL_COMMUNITY)
Admission: RE | Admit: 2016-01-02 | Discharge: 2016-01-02 | Disposition: A | Discharge status: Home / Self Care | Payer: BC Managed Care – PPO | Source: Ambulatory Visit | Attending: Obstetrics and Gynecology | Admitting: Obstetrics and Gynecology

## 2016-01-02 ENCOUNTER — Encounter (HOSPITAL_COMMUNITY): Payer: Self-pay

## 2016-01-02 ENCOUNTER — Ambulatory Visit (HOSPITAL_COMMUNITY): Admission: RE | Admit: 2016-01-02 | Payer: BC Managed Care – PPO | Source: Ambulatory Visit

## 2016-01-02 DIAGNOSIS — Z20828 Contact with and (suspected) exposure to other viral communicable diseases: Secondary | ICD-10-CM | POA: Diagnosis not present

## 2016-01-02 DIAGNOSIS — O98419 Viral hepatitis complicating pregnancy, unspecified trimester: Secondary | ICD-10-CM | POA: Diagnosis not present

## 2016-01-02 DIAGNOSIS — B2709 Gammaherpesviral mononucleosis with other complications: Secondary | ICD-10-CM

## 2016-01-02 DIAGNOSIS — B1910 Unspecified viral hepatitis B without hepatic coma: Secondary | ICD-10-CM | POA: Diagnosis not present

## 2016-01-02 DIAGNOSIS — Z3689 Encounter for other specified antenatal screening: Secondary | ICD-10-CM

## 2016-01-02 DIAGNOSIS — O9989 Other specified diseases and conditions complicating pregnancy, childbirth and the puerperium: Secondary | ICD-10-CM | POA: Insufficient documentation

## 2016-01-02 DIAGNOSIS — B178 Other specified acute viral hepatitis: Secondary | ICD-10-CM

## 2016-01-02 DIAGNOSIS — Z3A18 18 weeks gestation of pregnancy: Secondary | ICD-10-CM | POA: Diagnosis not present

## 2016-01-03 ENCOUNTER — Other Ambulatory Visit (HOSPITAL_COMMUNITY): Payer: Self-pay

## 2016-01-04 ENCOUNTER — Encounter (HOSPITAL_COMMUNITY): Payer: Self-pay

## 2016-01-04 ENCOUNTER — Other Ambulatory Visit (HOSPITAL_COMMUNITY): Payer: Self-pay

## 2016-02-05 NOTE — L&D Delivery Note (Signed)
Patient was C/C/+3 and pushed for 5 minutes with epidural.   NSVD  female infant, Apgars 7,9 , weight P.   The patient had a midline second degree perineal laceration repaired with 2-0 vicryl R. Fundus was firm. EBL was expected amount. Placenta was delivered intact. Vagina was clear.  Baby was vigorous and doing skin to skin with mother.  Carolyn Hill A

## 2016-04-29 LAB — OB RESULTS CONSOLE GBS: STREP GROUP B AG: NEGATIVE

## 2016-06-05 ENCOUNTER — Other Ambulatory Visit: Payer: Self-pay | Admitting: Obstetrics and Gynecology

## 2016-06-07 ENCOUNTER — Inpatient Hospital Stay (HOSPITAL_COMMUNITY)
Admission: AD | Admit: 2016-06-07 | Discharge: 2016-06-08 | DRG: 775 | Disposition: A | Discharge status: Home / Self Care | Payer: BC Managed Care – PPO | Source: Ambulatory Visit | Attending: Obstetrics and Gynecology | Admitting: Obstetrics and Gynecology

## 2016-06-07 ENCOUNTER — Encounter (HOSPITAL_COMMUNITY): Payer: Self-pay | Admitting: *Deleted

## 2016-06-07 ENCOUNTER — Inpatient Hospital Stay (HOSPITAL_COMMUNITY): Payer: BC Managed Care – PPO | Admitting: Anesthesiology

## 2016-06-07 DIAGNOSIS — Z3A41 41 weeks gestation of pregnancy: Secondary | ICD-10-CM | POA: Diagnosis not present

## 2016-06-07 DIAGNOSIS — O480 Post-term pregnancy: Secondary | ICD-10-CM | POA: Diagnosis present

## 2016-06-07 LAB — CBC
HEMATOCRIT: 35.7 % — AB (ref 36.0–46.0)
Hemoglobin: 12.4 g/dL (ref 12.0–15.0)
MCH: 31.5 pg (ref 26.0–34.0)
MCHC: 34.7 g/dL (ref 30.0–36.0)
MCV: 90.6 fL (ref 78.0–100.0)
PLATELETS: 218 10*3/uL (ref 150–400)
RBC: 3.94 MIL/uL (ref 3.87–5.11)
RDW: 13.8 % (ref 11.5–15.5)
WBC: 11.2 10*3/uL — ABNORMAL HIGH (ref 4.0–10.5)

## 2016-06-07 LAB — TYPE AND SCREEN
ABO/RH(D): O POS
Antibody Screen: NEGATIVE

## 2016-06-07 MED ORDER — SODIUM CHLORIDE 0.9% FLUSH
3.0000 mL | Freq: Two times a day (BID) | INTRAVENOUS | Status: DC
Start: 2016-06-07 — End: 2016-06-08

## 2016-06-07 MED ORDER — OXYCODONE-ACETAMINOPHEN 5-325 MG PO TABS: 1.0000 | ORAL_TABLET | ORAL | Status: DC | PRN

## 2016-06-07 MED ORDER — OXYTOCIN BOLUS FROM INFUSION: 500.0000 mL | Freq: Once | INTRAVENOUS | Status: DC

## 2016-06-07 MED ORDER — FENTANYL 2.5 MCG/ML BUPIVACAINE 1/10 % EPIDURAL INFUSION (WH - ANES)
14.0000 mL/h | INTRAMUSCULAR | Status: DC | PRN
  Administered 2016-06-07: 14 mL/h via EPIDURAL
  Filled 2016-06-07: qty 100

## 2016-06-07 MED ORDER — MAGNESIUM HYDROXIDE 400 MG/5ML PO SUSP: 30.0000 mL | ORAL | Status: DC | PRN

## 2016-06-07 MED ORDER — OXYTOCIN 40 UNITS IN LACTATED RINGERS INFUSION - SIMPLE MED
1.0000 m[IU]/min | INTRAVENOUS | Status: DC
  Administered 2016-06-07: 2 m[IU]/min via INTRAVENOUS
  Filled 2016-06-07: qty 1000

## 2016-06-07 MED ORDER — ONDANSETRON HCL 4 MG/2ML IJ SOLN: 4.0000 mg | Freq: Four times a day (QID) | INTRAMUSCULAR | Status: DC | PRN

## 2016-06-07 MED ORDER — ACETAMINOPHEN 325 MG PO TABS: 650.0000 mg | ORAL_TABLET | ORAL | Status: DC | PRN

## 2016-06-07 MED ORDER — METHYLERGONOVINE MALEATE 0.2 MG PO TABS: 0.2000 mg | ORAL_TABLET | ORAL | Status: DC | PRN

## 2016-06-07 MED ORDER — METHYLERGONOVINE MALEATE 0.2 MG/ML IJ SOLN: 0.2000 mg | INTRAMUSCULAR | Status: DC | PRN

## 2016-06-07 MED ORDER — COCONUT OIL OIL: 1.0000 "application " | TOPICAL_OIL | Status: DC | PRN

## 2016-06-07 MED ORDER — FERROUS SULFATE 325 (65 FE) MG PO TABS
325.0000 mg | ORAL_TABLET | Freq: Two times a day (BID) | ORAL | Status: DC
  Administered 2016-06-08 (×2): 325 mg via ORAL
  Filled 2016-06-07 (×2): qty 1

## 2016-06-07 MED ORDER — TERBUTALINE SULFATE 1 MG/ML IJ SOLN
0.2500 mg | Freq: Once | INTRAMUSCULAR | Status: DC | PRN
  Filled 2016-06-07: qty 1

## 2016-06-07 MED ORDER — LACTATED RINGERS IV SOLN: 500.0000 mL | INTRAVENOUS | Status: DC | PRN

## 2016-06-07 MED ORDER — IBUPROFEN 800 MG PO TABS
800.0000 mg | ORAL_TABLET | Freq: Three times a day (TID) | ORAL | Status: DC
  Administered 2016-06-07 – 2016-06-08 (×3): 800 mg via ORAL
  Filled 2016-06-07 (×3): qty 1

## 2016-06-07 MED ORDER — SENNOSIDES-DOCUSATE SODIUM 8.6-50 MG PO TABS
2.0000 | ORAL_TABLET | ORAL | Status: DC
  Administered 2016-06-07: 2 via ORAL
  Filled 2016-06-07: qty 2

## 2016-06-07 MED ORDER — DIPHENHYDRAMINE HCL 25 MG PO CAPS: 25.0000 mg | ORAL_CAPSULE | Freq: Four times a day (QID) | ORAL | Status: DC | PRN

## 2016-06-07 MED ORDER — SODIUM CHLORIDE 0.9% FLUSH: 3.0000 mL | INTRAVENOUS | Status: DC | PRN

## 2016-06-07 MED ORDER — SOD CITRATE-CITRIC ACID 500-334 MG/5ML PO SOLN: 30.0000 mL | ORAL | Status: DC | PRN

## 2016-06-07 MED ORDER — ZOLPIDEM TARTRATE 5 MG PO TABS: 5.0000 mg | ORAL_TABLET | Freq: Every evening | ORAL | Status: DC | PRN

## 2016-06-07 MED ORDER — WITCH HAZEL-GLYCERIN EX PADS: 1.0000 "application " | MEDICATED_PAD | CUTANEOUS | Status: DC | PRN

## 2016-06-07 MED ORDER — LACTATED RINGERS IV SOLN: 500.0000 mL | Freq: Once | INTRAVENOUS | Status: DC

## 2016-06-07 MED ORDER — OXYTOCIN 40 UNITS IN LACTATED RINGERS INFUSION - SIMPLE MED: 2.5000 [IU]/h | INTRAVENOUS | Status: DC

## 2016-06-07 MED ORDER — LACTATED RINGERS IV SOLN
INTRAVENOUS | Status: DC
  Administered 2016-06-07 (×2): via INTRAVENOUS

## 2016-06-07 MED ORDER — SIMETHICONE 80 MG PO CHEW: 80.0000 mg | CHEWABLE_TABLET | ORAL | Status: DC | PRN

## 2016-06-07 MED ORDER — EPHEDRINE 5 MG/ML INJ
10.0000 mg | INTRAVENOUS | Status: DC | PRN
  Filled 2016-06-07: qty 2

## 2016-06-07 MED ORDER — LIDOCAINE HCL (PF) 1 % IJ SOLN
30.0000 mL | INTRAMUSCULAR | Status: DC | PRN
  Filled 2016-06-07: qty 30

## 2016-06-07 MED ORDER — PHENYLEPHRINE 40 MCG/ML (10ML) SYRINGE FOR IV PUSH (FOR BLOOD PRESSURE SUPPORT)
80.0000 ug | PREFILLED_SYRINGE | INTRAVENOUS | Status: DC | PRN
  Filled 2016-06-07: qty 10
  Filled 2016-06-07: qty 5

## 2016-06-07 MED ORDER — MEASLES, MUMPS & RUBELLA VAC ~~LOC~~ INJ: 0.5000 mL | INJECTION | Freq: Once | SUBCUTANEOUS | Status: DC

## 2016-06-07 MED ORDER — PRENATAL MULTIVITAMIN CH
1.0000 | ORAL_TABLET | Freq: Every day | ORAL | Status: DC
  Administered 2016-06-08: 1 via ORAL
  Filled 2016-06-07: qty 1

## 2016-06-07 MED ORDER — PHENYLEPHRINE 40 MCG/ML (10ML) SYRINGE FOR IV PUSH (FOR BLOOD PRESSURE SUPPORT)
80.0000 ug | PREFILLED_SYRINGE | INTRAVENOUS | Status: DC | PRN
  Filled 2016-06-07: qty 5

## 2016-06-07 MED ORDER — SODIUM CHLORIDE 0.9 % IV SOLN: 250.0000 mL | INTRAVENOUS | Status: DC | PRN

## 2016-06-07 MED ORDER — ONDANSETRON HCL 4 MG/2ML IJ SOLN: 4.0000 mg | INTRAMUSCULAR | Status: DC | PRN

## 2016-06-07 MED ORDER — TETANUS-DIPHTH-ACELL PERTUSSIS 5-2.5-18.5 LF-MCG/0.5 IM SUSP: 0.5000 mL | Freq: Once | INTRAMUSCULAR | Status: DC

## 2016-06-07 MED ORDER — OXYCODONE-ACETAMINOPHEN 5-325 MG PO TABS: 2.0000 | ORAL_TABLET | ORAL | Status: DC | PRN

## 2016-06-07 MED ORDER — BENZOCAINE-MENTHOL 20-0.5 % EX AERO
1.0000 "application " | INHALATION_SPRAY | CUTANEOUS | Status: DC | PRN
  Filled 2016-06-07: qty 56

## 2016-06-07 MED ORDER — LIDOCAINE HCL (PF) 1 % IJ SOLN
INTRAMUSCULAR | Status: DC | PRN
  Administered 2016-06-07 (×2): 5 mL

## 2016-06-07 MED ORDER — ONDANSETRON HCL 4 MG PO TABS: 4.0000 mg | ORAL_TABLET | ORAL | Status: DC | PRN

## 2016-06-07 MED ORDER — DIPHENHYDRAMINE HCL 50 MG/ML IJ SOLN: 12.5000 mg | INTRAMUSCULAR | Status: DC | PRN

## 2016-06-07 MED ORDER — DIBUCAINE 1 % RE OINT: 1.0000 "application " | TOPICAL_OINTMENT | RECTAL | Status: DC | PRN

## 2016-06-07 NOTE — Anesthesia Preprocedure Evaluation (Signed)
Anesthesia Evaluation  Patient identified by MRN, date of birth, ID band Patient awake    Reviewed: Allergy & Precautions, H&P , NPO status , Patient's Chart, lab work & pertinent test results, reviewed documented beta blocker date and time   History of Anesthesia Complications Negative for: history of anesthetic complications  Airway Mallampati: II  TM Distance: >3 FB Neck ROM: full    Dental no notable dental hx. (+) Teeth Intact   Pulmonary neg pulmonary ROS,    Pulmonary exam normal breath sounds clear to auscultation       Cardiovascular Exercise Tolerance: Good negative cardio ROS Normal cardiovascular exam Rhythm:regular Rate:Normal     Neuro/Psych negative neurological ROS  negative psych ROS   GI/Hepatic negative GI ROS, Neg liver ROS,   Endo/Other  negative endocrine ROS  Renal/GU negative Renal ROS  negative genitourinary   Musculoskeletal   Abdominal   Peds  Hematology negative hematology ROS (+)   Anesthesia Other Findings   Reproductive/Obstetrics negative OB ROS (+) Pregnancy                             Anesthesia Physical Anesthesia Plan  ASA: II  Anesthesia Plan: Epidural   Post-op Pain Management:    Induction:   Airway Management Planned:   Additional Equipment:   Intra-op Plan:   Post-operative Plan:   Informed Consent: I have reviewed the patients History and Physical, chart, labs and discussed the procedure including the risks, benefits and alternatives for the proposed anesthesia with the patient or authorized representative who has indicated his/her understanding and acceptance.   Dental Advisory Given  Plan Discussed with: CRNA  Anesthesia Plan Comments:         Anesthesia Quick Evaluation

## 2016-06-07 NOTE — H&P (Signed)
36 y.o. 8754w0d  G3P2002 comes in for postdates induction.  Otherwise has good fetal movement and no bleeding.  History reviewed. No pertinent past medical history.  Past Surgical History:  Procedure Laterality Date  . WISDOM TOOTH EXTRACTION  2004    OB History  Gravida Para Term Preterm AB Living  3 2 2     2   SAB TAB Ectopic Multiple Live Births          2    # Outcome Date GA Lbr Len/2nd Weight Sex Delivery Anes PTL Lv  3 Current           2 Term 06/18/13 5217w6d 11:44 / 00:24 8 lb 7 oz (3.827 kg) M Vag-Spont EPI  LIV  1 Term 04/03/10 234w3d 05:00 7 lb 9 oz (3.43 kg) M Vag-Spont EPI N LIV      Social History   Social History  . Marital status: Married    Spouse name: N/A  . Number of children: N/A  . Years of education: N/A   Occupational History  . Not on file.   Social History Main Topics  . Smoking status: Never Smoker  . Smokeless tobacco: Never Used  . Alcohol use No  . Drug use: No  . Sexual activity: Not Currently    Birth control/ protection: None   Other Topics Concern  . Not on file   Social History Narrative  . No narrative on file   Patient has no known allergies.    Prenatal Transfer Tool  Maternal Diabetes: No Genetic Screening: Declined- AFP neg Maternal Ultrasounds/Referrals: Normal Fetal Ultrasounds or other Referrals:  Referred to Materal Fetal Medicine for early pregnancy CMV exposure- all US normal. Maternal Substance Abuse:  No Significant Maternal Medications:  None Significant Maternal Lab Results: None  Other PNC: Otherwise uncomplicated.    Vitals:   06/07/16 1023 06/07/16 1056  BP: (!) 108/56 108/63  Pulse: 82 80  Temp:       Lungs/Cor:  NAD Abdomen:  soft, gravid Ex:  no cords, erythema SVE:  3-4/100/-2, AROM clear FHTs:  120s, good STV, NST R Toco:  q occ   A/P   Post dated induction.   GBS neg.   Razi Hickle A

## 2016-06-07 NOTE — Lactation Note (Signed)
This note was copied from a baby's chart. Lactation Consultation Note  Patient Name: Carolyn Hill WUJWJ'XToday's Date: 06/07/2016 Reason for consult: Initial assessment   Initial assessment with Exp BF mom of < 1 hour old [facility]. Mom reports she BF her 2 older children for 1 year each.   Infant STS with mom and cueing to feed, mom allowing infant to self latch. When LC attempted to see infant latch, mom would pull covers up and cover herself and the infant. Infant left STS with mom, quietly alert.   Enc mom to BF infant STS 8-12 x in 24 hours at first feeding cues using both breasts with each feeding. Enc mom to use pillow and head support with feeding. Mom reports she knows how to hand express. Enc mom to use pillow and head support with feeding. Mom reports she has a Medela PIS for home use. Mom without questions/concerns at this time.   BF resources handout and [facility] broch[facility] given, mom informed of IP/OP services, BF Support Groups and LC phone #. Enc mom to call out for feeding assistance as needed.      Maternal Data Formula Feeding for Exclusion: No Has patient been taught Hand Expression?: No (mom reports she knows how) Does the patient have breastfeeding experience prior to this delivery?: Yes  Feeding    LATCH Score/Interventions Latch:  (infant STS and rooting)              Intervention(s): Breastfeeding basics reviewed;Support Pillows;Position options;Skin to skin     Lactation Tools Discussed/Used WIC Program: No   Consult Status Consult Status: Follow-up Date: 06/08/16 Follow-up type: In-patient    Silas FloodSharon S Carely Nappier 06/07/2016, 5:40 PM

## 2016-06-07 NOTE — Anesthesia Pain Management Evaluation Note (Signed)
  CRNA Pain Management Visit Note  Patient: Carolyn RegisterCary Grays, 36 y.o., female  "Hello I am a member of the anesthesia team at [facility]. We have an anesthesia team available at all times to provide care throughout the hospital, including epidural management and anesthesia for C-section. I don't know your plan for the delivery whether it a natural birth, water birth, IV sedation, nitrous supplementation, doula or epidural, but we want to meet your pain goals."   1.Was your pain managed to your expectations on prior hospitalizations?   No prior hospitalizations  2.What is your expectation for pain management during this hospitalization?     Epidural  3.How can we help you reach that goal? epidural  Record the patient's initial score and the patient's pain goal.   Pain: 4  Pain Goal: 4 [facility] wants you to be able to say your pain was always managed very well.  Merilyn Pagan 06/07/2016

## 2016-06-07 NOTE — Anesthesia Procedure Notes (Signed)
Epidural Patient location during procedure: OB  Staffing Anesthesiologist: Spirit Wernli Performed: anesthesiologist   Preanesthetic Checklist Completed: patient identified, site marked, surgical consent, pre-op evaluation, timeout performed, IV checked, risks and benefits discussed and monitors and equipment checked  Epidural Patient position: sitting Prep: DuraPrep Patient monitoring: heart rate, continuous pulse ox and blood pressure Approach: right paramedian Location: L3-L4 Injection technique: LOR saline  Needle:  Needle type: Tuohy  Needle gauge: 17 G Needle length: 9 cm and 9 Needle insertion depth: 6 cm Catheter type: closed end flexible Catheter size: 20 Guage Catheter at skin depth: 10 cm Test dose: negative  Assessment Events: blood not aspirated, injection not painful, no injection resistance, negative IV test and no paresthesia  Additional Notes Patient identified. Risks/Benefits/Options discussed with patient including but not limited to bleeding, infection, nerve damage, paralysis, failed block, incomplete pain control, headache, blood pressure changes, nausea, vomiting, reactions to medication both or allergic, itching and postpartum back pain. Confirmed with bedside nurse the patient's most recent platelet count. Confirmed with patient that they are not currently taking any anticoagulation, have any bleeding history or any family history of bleeding disorders. Patient expressed understanding and wished to proceed. All questions were answered. Sterile technique was used throughout the entire procedure. Please see nursing notes for vital signs. Test dose was given through epidural needle and negative prior to continuing to dose epidural or start infusion. Warning signs of high block given to the patient including shortness of breath, tingling/numbness in hands, complete motor block, or any concerning symptoms with instructions to call for help. Patient was given  instructions on fall risk and not to get out of bed. All questions and concerns addressed with instructions to call with any issues.     

## 2016-06-08 LAB — CBC
HCT: 29.5 % — ABNORMAL LOW (ref 36.0–46.0)
HEMOGLOBIN: 10.1 g/dL — AB (ref 12.0–15.0)
MCH: 31.1 pg (ref 26.0–34.0)
MCHC: 34.2 g/dL (ref 30.0–36.0)
MCV: 90.8 fL (ref 78.0–100.0)
PLATELETS: 183 10*3/uL (ref 150–400)
RBC: 3.25 MIL/uL — ABNORMAL LOW (ref 3.87–5.11)
RDW: 13.8 % (ref 11.5–15.5)
WBC: 12.8 10*3/uL — ABNORMAL HIGH (ref 4.0–10.5)

## 2016-06-08 LAB — RPR: RPR Ser Ql: NONREACTIVE

## 2016-06-08 NOTE — Progress Notes (Signed)
Patient is eating, ambulating, voiding.  Pain control is good.  Vitals:   06/07/16 1800 06/07/16 1840 06/07/16 1947 06/08/16 0000  BP: (!) 101/53 98/76 (!) 99/49 (!) 106/48  Pulse: 67 73 (!) 59 70  Resp:  18 14 16   Temp:  98.2 F (36.8 C) 98.3 F (36.8 C) 98.5 F (36.9 C)  TempSrc:  Oral Oral Oral  SpO2:  99%    Weight:      Height:        Fundus firm Perineum without swelling.  Lab Results  Component Value Date   WBC 12.8 (H) 06/08/2016   HGB 10.1 (L) 06/08/2016   HCT 29.5 (L) 06/08/2016   MCV 90.8 06/08/2016   PLT 183 06/08/2016    --/--/O POS (05/04 0955)/RI  A/P Post partum day 1.  Routine care.  Expect d/c routine.    Parents desires circumsision.  All risks, benefits and alternatives discussed with the mother. Carolyn Hill A

## 2016-06-08 NOTE — Anesthesia Postprocedure Evaluation (Signed)
Anesthesia Post Note  Patient: Carolyn RegisterCary Hill  Procedure(s) Performed: * No procedures listed *  Patient location during evaluation: Mother Baby Anesthesia Type: Epidural Level of consciousness: awake and alert, oriented and patient cooperative Pain management: pain level controlled Vital Signs Assessment: post-procedure vital signs reviewed and stable Respiratory status: spontaneous breathing Cardiovascular status: stable Postop Assessment: no headache, epidural receding, patient able to bend at knees and no signs of nausea or vomiting Anesthetic complications: no Comments: Pain score 3.        Last Vitals:  Vitals:   06/07/16 1947 06/08/16 0000  BP: (!) 99/49 (!) 106/48  Pulse: (!) 59 70  Resp: 14 16  Temp: 36.8 C 36.9 C    Last Pain:  Vitals:   06/08/16 0750  TempSrc:   PainSc: 0-No pain   Pain Goal:                 [name],Carolyn Hill

## 2016-06-08 NOTE — Lactation Note (Signed)
This note was copied from a baby's chart. Lactation Consultation Note  Patient Name: Carolyn Hill'WToday's Date: 06/08/2016 Reason for consult: Follow-up assessment Infant is 6324 hours old & seen by Lactation for follow-up assessment. RN called LC to have LC come see mom because they were going home soon. Mom was BF baby on right breast in football hold when [facility] (RN gave latch score). Baby's suckling had slowed but swallows were noted when baby was stimulated and suckled. Mom reports both nipples are sore and has noticed her right nipple is cracked (LC did not see nipples because baby was BF on right breast and mom did not want to show LC her left breast). Provided mom with comfort gels - discussed use & care. Mom states she BF her other children for ~5764yr with no problems in the beginning but had supply issues after going back to work, which required supplementation. Mom states she knows what to look for in a latch and has been unlatching and relatching if it hurts but stated that sometimes baby will become fussy so will keep him latched even if it hurts. Encouraged mom to use good pillow support and to make sure baby's nose touches her breast during BF, which may require putting a rolled up blanket under mom's hand to keep him close throughout the entire feed. Mom states she has coconut oil at home- encouraged mom to use for sore nipples once the tissue breakdown has healed. Mom encouraged to feed baby 8-12 times/24 hours and with feeding cues.  Discussed engorgement prevention and treatment. Mom states she has her own personal DEBP and will be going back to work in the fall (mom is a Runner, broadcasting/film/videoteacher). Reminded mom of LC O/P number and support group. Mom reports no questions at this time.   Maternal Data    Feeding Feeding Type: Breast Fed Length of feed: 15 min  LATCH Score/Interventions Latch: Grasps breast easily, tongue down, lips flanged, rhythmical sucking. Intervention(s): Adjust  position;Assist with latch  Audible Swallowing: Spontaneous and intermittent  Type of Nipple: Everted at rest and after stimulation  Comfort (Breast/Nipple): Filling, red/small blisters or bruises, mild/mod discomfort  Problem noted: Mild/Moderate discomfort  Hold (Positioning): Assistance needed to correctly position infant at breast and maintain latch.  LATCH Score: 8  Lactation Tools Discussed/Used     Consult Status Consult Status: Complete    Oneal GroutLaura C Kesi Perrow 06/08/2016, 5:51 PM

## 2016-06-08 NOTE — Discharge Summary (Signed)
Obstetric Discharge Summary Reason for Admission: induction of labor Prenatal Procedures: NST Intrapartum Procedures: spontaneous vaginal delivery Postpartum Procedures: none Complications-Operative and Postpartum: 2 degree perineal laceration Hemoglobin  Date Value Ref Range Status  06/08/2016 10.1 (L) 12.0 - 15.0 g/dL Final   HCT  Date Value Ref Range Status  06/08/2016 29.5 (L) 36.0 - 46.0 % Final    Discharge Diagnoses: Term Pregnancy-delivered  Discharge Information: Date: 06/08/2016 Activity: pelvic rest Diet: routine Medications: Ibuprofen Condition: stable Instructions: refer to practice specific booklet Discharge to: home Follow-up Information    Carrington ClampHorvath, Hassaan Crite, MD Follow up in 4 week(s).   Specialty:  Obstetrics and Gynecology Contact information: [address] [place] [address] [phone]           Newborn Data: Live born female  Birth Weight: 8 lb 15.7 oz (4075 g) APGAR: 7, 9  Home with mother.  Samaiyah Howes A 06/08/2016, 10:19 AM

## 2020-04-28 ENCOUNTER — Ambulatory Visit (INDEPENDENT_AMBULATORY_CARE_PROVIDER_SITE_OTHER): Payer: BC Managed Care – PPO | Admitting: Medical

## 2020-04-28 ENCOUNTER — Other Ambulatory Visit: Payer: Self-pay

## 2020-04-28 ENCOUNTER — Encounter: Payer: Self-pay | Admitting: Medical

## 2020-04-28 VITALS — BP 108/79 | HR 91 | Temp 98.7°F | Resp 18 | Ht 66.0 in | Wt 155.6 lb

## 2020-04-28 DIAGNOSIS — Z0000 Encounter for general adult medical examination without abnormal findings: Secondary | ICD-10-CM | POA: Diagnosis not present

## 2020-04-28 DIAGNOSIS — Z124 Encounter for screening for malignant neoplasm of cervix: Secondary | ICD-10-CM | POA: Diagnosis not present

## 2020-04-28 NOTE — Progress Notes (Signed)
Subjective:    Patient ID: Carolyn Hill, female    DOB: 25-Feb-1980, 40 y.o.   MRN: [id number]  HPI  Pt in for first time. Decided to go ahead and do wellness exam.   Pt states no recent pcp. Pt originally from [place]. 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 Pt does exercise about 4 times a week. Pt is eating healthy overall. Nonsmoker. Occasional glass of wine about once or twice a week. 3 children.   No meds.  Last pap over 3 years ago.   Pt had 2 dose of phizer covid. Also home test and positive.  No acute illness symptoms.    Review of Systems  Constitutional: Negative for chills, fatigue and fever.  HENT: Negative for congestion and ear discharge.   Respiratory: Negative for cough, chest tightness, shortness of breath and wheezing.   Cardiovascular: Negative for chest pain and palpitations.  Gastrointestinal: Negative for abdominal pain.  Musculoskeletal: Negative for back pain.  Skin: Negative for rash.  Neurological: Negative for dizziness, speech difficulty, weakness, numbness and headaches.  Hematological: Negative for adenopathy. Does not bruise/bleed easily.  Psychiatric/Behavioral: Negative for behavioral problems, dysphoric mood and hallucinations. The patient is not nervous/anxious.     History reviewed. No pertinent past medical history.   Social History   Socioeconomic History  . Marital status: Married    Spouse name: Not on file  . Number of children: Not on file  . Years of education: Not on file  . Highest education level: Not on file  Occupational History  . Not on file  Tobacco Use  . Smoking status: Never Smoker  . Smokeless tobacco: Never Used  Vaping Use  . Vaping Use: Never used  Substance and Sexual Activity  . Alcohol use: Yes    Comment: 1 glass of wine about 1-2 times a week.  . Drug use: No  . Sexual activity: Not Currently    Birth control/protection: None  Other Topics Concern  . Not on file  Social History  Narrative  . Not on file   Social Determinants of Health   Financial Resource Strain: Not on file  Food Insecurity: Not on file  Transportation Needs: Not on file  Physical Activity: Not on file  Stress: Not on file  Social Connections: Not on file  Intimate Partner Violence: Not on file    Past Surgical History:  Procedure Laterality Date  . WISDOM TOOTH EXTRACTION  2004    Family History  Problem Relation Age of Onset  . Stroke Mother   . Miscarriages / [place] Mother   . Cancer Father     No Known Allergies  Current Outpatient Medications on File Prior to Visit  Medication Sig Dispense Refill  . acetaminophen (TYLENOL) 325 MG tablet Take 650 mg by mouth every 6 (six) hours as needed for headache. (Patient not taking: Reported on 04/28/2020)    . calcium carbonate (TUMS - DOSED IN MG ELEMENTAL CALCIUM) 500 MG chewable tablet Chew 1-2 tablets by mouth 4 (four) times daily as needed for indigestion or heartburn. (Patient not taking: Reported on 04/28/2020)    . Prenatal Vit-Fe Fumarate-FA (PRENATAL MULTIVITAMIN) TABS tablet Take 1 tablet by mouth at bedtime. (Patient not taking: Reported on 04/28/2020)     No current facility-administered medications on file prior to visit.    BP 108/79   Pulse 91   Temp 98.7 F (37.1 C) (Oral)   Resp 18   Ht 5\' 6"  (1.676 m)  Wt 155 lb 9.6 oz (70.6 kg)   LMP 04/06/2020   SpO2 100%   BMI 25.11 kg/m       Objective:   Physical Exam  General Mental Status- Alert. General Appearance- Not in acute distress.   Skin General: Color- Normal Color. Moisture- Normal Moisture.  Neck Carotid Arteries- Normal color. Moisture- Normal Moisture. No carotid bruits. No JVD.  Chest and Lung Exam Auscultation: Breath Sounds:-Normal.  Cardiovascular Auscultation:Rythm- Regular. Murmurs & Other Heart Sounds:Auscultation of the heart reveals- No Murmurs.  Abdomen Inspection:-Inspeection Normal. Palpation/Percussion:Note:No mass.  Palpation and Percussion of the abdomen reveal- Non Tender, Non Distended + BS, no rebound or guarding.   Neurologic Cranial Nerve exam:- CN III-XII intact(No nystagmus), symmetric smile. Strength:- 5/5 equal and symmetric strength both upper and lower extremities.      Assessment & Plan:  For you wellness exam today I have ordered cbc, cmp and  lipid panel.  Vaccine up to date.  Recommend exercise and healthy diet.  We will let you know lab results as they come in.  Follow up date appointment will be determined after lab review.   Esperanza Richters, PA-C

## 2020-04-28 NOTE — Patient Instructions (Addendum)
For you wellness exam today I have ordered cbc, cmp and  lipid panel. Please schedule future labs.  Vaccine up to date.   Recommend exercise and healthy diet.  We will let you know lab results as they come in.  Follow up date appointment will be determined after lab review.    Preventive Care 48-39 Years Old, Female Preventive care refers to lifestyle choices and visits with your health care provider that can promote health and wellness. This includes:  A yearly physical exam. This is also called an annual wellness visit.  Regular dental and eye exams.  Immunizations.  Screening for certain conditions.  Healthy lifestyle choices, such as: ? Eating a healthy diet. ? Getting regular exercise. ? Not using drugs or products that contain nicotine and tobacco. ? Limiting alcohol use. What can I expect for my preventive care visit? Physical exam Your health care provider may check your:  Height and weight. These may be used to calculate your BMI (body mass index). BMI is a measurement that tells if you are at a healthy weight.  Heart rate and blood pressure.  Body temperature.  Skin for abnormal spots. Counseling Your health care provider may ask you questions about your:  Past medical problems.  Family's medical history.  Alcohol, tobacco, and drug use.  Emotional well-being.  Home life and relationship well-being.  Sexual activity.  Diet, exercise, and sleep habits.  Work and work Statistician.  Access to firearms.  Method of birth control.  Menstrual cycle.  Pregnancy history. What immunizations do I need? Vaccines are usually given at various ages, according to a schedule. Your health care provider will recommend vaccines for you based on your age, medical history, and lifestyle or other factors, such as travel or where you work.   What tests do I need? Blood tests  Lipid and cholesterol levels. These may be checked every 5 years starting at age  21.  Hepatitis C test.  Hepatitis B test. Screening  Diabetes screening. This is done by checking your blood sugar (glucose) after you have not eaten for a while (fasting).  STD (sexually transmitted disease) testing, if you are at risk.  BRCA-related cancer screening. This may be done if you have a family history of breast, ovarian, tubal, or peritoneal cancers.  Pelvic exam and Pap test. This may be done every 3 years starting at age 64. Starting at age 27, this may be done every 5 years if you have a Pap test in combination with an HPV test. Talk with your health care provider about your test results, treatment options, and if necessary, the need for more tests.   Follow these instructions at home: Eating and drinking  Eat a healthy diet that includes fresh fruits and vegetables, whole grains, lean protein, and low-fat dairy products.  Take vitamin and mineral supplements as recommended by your health care provider.  Do not drink alcohol if: ? Your health care provider tells you not to drink. ? You are pregnant, may be pregnant, or are planning to become pregnant.  If you drink alcohol: ? Limit how much you have to 0-1 drink a day. ? Be aware of how much alcohol is in your drink. In the [place] one drink equals one 12 oz bottle of beer (355 mL), one 5 oz glass of wine (148 mL), or one 1 oz glass of hard liquor (44 mL).   Lifestyle  Take daily care of your teeth and gums. Brush your teeth every morning  and night with fluoride toothpaste. Floss one time each day.  Stay active. Exercise for at least 30 minutes 5 or more days each week.  Do not use any products that contain nicotine or tobacco, such as cigarettes, e-cigarettes, and chewing tobacco. If you need help quitting, ask your health care provider.  Do not use drugs.  If you are sexually active, practice safe sex. Use a condom or other form of protection to prevent STIs (sexually transmitted infections).  If you do not  wish to become pregnant, use a form of birth control. If you plan to become pregnant, see your health care provider for a prepregnancy visit.  Find healthy ways to cope with stress, such as: ? Meditation, yoga, or listening to music. ? Journaling. ? Talking to a trusted person. ? Spending time with friends and family. Safety  Always wear your seat belt while driving or riding in a vehicle.  Do not drive: ? If you have been drinking alcohol. Do not ride with someone who has been drinking. ? When you are tired or distracted. ? While texting.  Wear a helmet and other protective equipment during sports activities.  If you have firearms in your house, make sure you follow all gun safety procedures.  Seek help if you have been physically or sexually abused. What's next?  Go to your health care provider once a year for an annual wellness visit.  Ask your health care provider how often you should have your eyes and teeth checked.  Stay up to date on all vaccines. This information is not intended to replace advice given to you by your health care provider. Make sure you discuss any questions you have with your health care provider. Document Revised: 09/19/2019 Document Reviewed: 10/02/2017 Elsevier Patient Education  2021 [employer].

## 2020-05-03 ENCOUNTER — Other Ambulatory Visit: Payer: Self-pay

## 2020-05-03 ENCOUNTER — Other Ambulatory Visit (INDEPENDENT_AMBULATORY_CARE_PROVIDER_SITE_OTHER): Payer: BC Managed Care – PPO

## 2020-05-03 DIAGNOSIS — Z0000 Encounter for general adult medical examination without abnormal findings: Secondary | ICD-10-CM

## 2020-05-03 LAB — CBC WITH DIFFERENTIAL/PLATELET
Basophils Absolute: 0.1 10*3/uL (ref 0.0–0.1)
Basophils Relative: 0.8 % (ref 0.0–3.0)
Eosinophils Absolute: 0.1 10*3/uL (ref 0.0–0.7)
Eosinophils Relative: 1.1 % (ref 0.0–5.0)
HCT: 39.9 % (ref 36.0–46.0)
Hemoglobin: 13.7 g/dL (ref 12.0–15.0)
Lymphocytes Relative: 30.2 % (ref 12.0–46.0)
Lymphs Abs: 2.1 10*3/uL (ref 0.7–4.0)
MCHC: 34.4 g/dL (ref 30.0–36.0)
MCV: 90.2 fl (ref 78.0–100.0)
Monocytes Absolute: 0.5 10*3/uL (ref 0.1–1.0)
Monocytes Relative: 7.1 % (ref 3.0–12.0)
Neutro Abs: 4.2 10*3/uL (ref 1.4–7.7)
Neutrophils Relative %: 60.8 % (ref 43.0–77.0)
Platelets: 269 10*3/uL (ref 150.0–400.0)
RBC: 4.43 Mil/uL (ref 3.87–5.11)
RDW: 12.3 % (ref 11.5–15.5)
WBC: 6.9 10*3/uL (ref 4.0–10.5)

## 2020-05-03 LAB — COMPREHENSIVE METABOLIC PANEL
ALT: 14 U/L (ref 0–35)
AST: 18 U/L (ref 0–37)
Albumin: 4.6 g/dL (ref 3.5–5.2)
Alkaline Phosphatase: 60 U/L (ref 39–117)
BUN: 14 mg/dL (ref 6–23)
CO2: 29 mEq/L (ref 19–32)
Calcium: 9.5 mg/dL (ref 8.4–10.5)
Chloride: 104 mEq/L (ref 96–112)
Creatinine, Ser: 0.79 mg/dL (ref 0.40–1.20)
GFR: 94.9 mL/min (ref >60.00)
Glucose, Bld: 94 mg/dL (ref 70–99)
Potassium: 3.9 mEq/L (ref 3.5–5.1)
Sodium: 140 mEq/L (ref 135–145)
Total Bilirubin: 0.7 mg/dL (ref 0.2–1.2)
Total Protein: 7.1 g/dL (ref 6.0–8.3)

## 2020-05-03 LAB — LIPID PANEL
Cholesterol: 193 mg/dL (ref 0–200)
HDL: 58 mg/dL (ref >39.00)
LDL Cholesterol: 118 mg/dL — ABNORMAL HIGH (ref 0–99)
NonHDL: 135.2
Total CHOL/HDL Ratio: 3
Triglycerides: 87 mg/dL (ref 0.0–149.0)
VLDL: 17.4 mg/dL (ref 0.0–40.0)

## 2020-06-08 DIAGNOSIS — Z6824 Body mass index (BMI) 24.0-24.9, adult: Secondary | ICD-10-CM | POA: Diagnosis not present

## 2020-06-08 DIAGNOSIS — Z124 Encounter for screening for malignant neoplasm of cervix: Secondary | ICD-10-CM | POA: Diagnosis not present

## 2020-06-08 DIAGNOSIS — Z01419 Encounter for gynecological examination (general) (routine) without abnormal findings: Secondary | ICD-10-CM | POA: Diagnosis not present

## 2020-07-10 DIAGNOSIS — M62838 Other muscle spasm: Secondary | ICD-10-CM | POA: Diagnosis not present

## 2020-07-10 DIAGNOSIS — N393 Stress incontinence (female) (male): Secondary | ICD-10-CM | POA: Diagnosis not present

## 2020-07-10 DIAGNOSIS — M6289 Other specified disorders of muscle: Secondary | ICD-10-CM | POA: Diagnosis not present

## 2020-07-10 DIAGNOSIS — M6281 Muscle weakness (generalized): Secondary | ICD-10-CM | POA: Diagnosis not present

## 2020-07-19 DIAGNOSIS — N393 Stress incontinence (female) (male): Secondary | ICD-10-CM | POA: Diagnosis not present

## 2020-07-19 DIAGNOSIS — M6281 Muscle weakness (generalized): Secondary | ICD-10-CM | POA: Diagnosis not present

## 2020-07-19 DIAGNOSIS — M62838 Other muscle spasm: Secondary | ICD-10-CM | POA: Diagnosis not present

## 2020-07-19 DIAGNOSIS — M6289 Other specified disorders of muscle: Secondary | ICD-10-CM | POA: Diagnosis not present

## 2020-08-01 DIAGNOSIS — L814 Other melanin hyperpigmentation: Secondary | ICD-10-CM | POA: Diagnosis not present

## 2020-08-01 DIAGNOSIS — D229 Melanocytic nevi, unspecified: Secondary | ICD-10-CM | POA: Diagnosis not present

## 2020-08-01 DIAGNOSIS — D225 Melanocytic nevi of trunk: Secondary | ICD-10-CM | POA: Diagnosis not present

## 2020-08-01 DIAGNOSIS — D219 Benign neoplasm of connective and other soft tissue, unspecified: Secondary | ICD-10-CM | POA: Diagnosis not present

## 2020-09-12 DIAGNOSIS — H52203 Unspecified astigmatism, bilateral: Secondary | ICD-10-CM | POA: Diagnosis not present

## 2020-09-12 DIAGNOSIS — H5213 Myopia, bilateral: Secondary | ICD-10-CM | POA: Diagnosis not present

## 2020-09-12 DIAGNOSIS — H04123 Dry eye syndrome of bilateral lacrimal glands: Secondary | ICD-10-CM | POA: Diagnosis not present

## 2020-09-19 DIAGNOSIS — D2122 Benign neoplasm of connective and other soft tissue of left lower limb, including hip: Secondary | ICD-10-CM | POA: Diagnosis not present

## 2020-09-26 DIAGNOSIS — L905 Scar conditions and fibrosis of skin: Secondary | ICD-10-CM | POA: Diagnosis not present

## 2021-04-25 ENCOUNTER — Ambulatory Visit: Payer: BC Managed Care – PPO | Admitting: Medical

## 2021-04-25 VITALS — BP 112/65 | HR 69 | Temp 98.4°F | Resp 18 | Ht 66.0 in | Wt 157.0 lb

## 2021-04-25 DIAGNOSIS — J3489 Other specified disorders of nose and nasal sinuses: Secondary | ICD-10-CM

## 2021-04-25 DIAGNOSIS — R0981 Nasal congestion: Secondary | ICD-10-CM

## 2021-04-25 DIAGNOSIS — Z20818 Contact with and (suspected) exposure to other bacterial communicable diseases: Secondary | ICD-10-CM

## 2021-04-25 DIAGNOSIS — J029 Acute pharyngitis, unspecified: Secondary | ICD-10-CM

## 2021-04-25 LAB — POCT RAPID STREP A (OFFICE): Rapid Strep A Screen: NEGATIVE

## 2021-04-25 MED ORDER — FLUTICASONE PROPIONATE 50 MCG/ACT NA SUSP: 2.0000 | Freq: Every day | NASAL | 1 refills | Status: DC

## 2021-04-25 MED ORDER — AZITHROMYCIN 250 MG PO TABS: ORAL_TABLET | ORAL | 0 refills | Status: AC

## 2021-04-25 NOTE — Patient Instructions (Addendum)
Pharyngitis with strep exposure to your children with strep throat. Your rapid test is negative but will treat for strep clinically.  ? ?Rx azithromycin antibiotic ? ?For nasal congestion rx flonase. ? ?Follow up in 7-10 days or sooner if needed. ? ? ?

## 2021-04-25 NOTE — Addendum Note (Signed)
Addended by: Gwenevere Abbot on: 04/25/2021 02:25 PM ? ? Modules accepted: Orders ? ?

## 2021-04-25 NOTE — Progress Notes (Signed)
? ?  Subjective:  ? ? Patient ID: Carolyn Hill, female    DOB: January 15, 1981, 41 y.o.   MRN: [id number] ? ?HPI ? ?Pt in for st since Friday. Pain is mild-moderate. This weekend some bodyaches, and ha. 2 children of hers have strep. 3rd tested negative and his throat looked worse than others.  ? ?3rd child given antibiotic for strep. They have not started antbiotic. ? ? ?Review of Systems  ?Constitutional:  Positive for fatigue. Negative for chills and fever.  ?     Mild fatigue  ?HENT:  Positive for congestion and sinus pressure.   ?Respiratory:  Negative for cough, choking and shortness of breath.   ?Cardiovascular:  Negative for chest pain and palpitations.  ?Gastrointestinal:  Negative for abdominal pain, diarrhea and nausea.  ?Musculoskeletal:  Positive for myalgias. Negative for back pain.  ?Skin:  Negative for rash.  ? ? ?No past medical history on file. ?  ?Social History  ? ?Socioeconomic History  ? Marital status: Married  ?  Spouse name: Not on file  ? Number of children: Not on file  ? Years of education: Not on file  ? Highest education level: Not on file  ?Occupational History  ? Not on file  ?Tobacco Use  ? Smoking status: Never  ? Smokeless tobacco: Never  ?Vaping Use  ? Vaping Use: Never used  ?Substance and Sexual Activity  ? Alcohol use: Yes  ?  Comment: 1 glass of wine about 1-2 times a week.  ? Drug use: No  ? Sexual activity: Not Currently  ?  Birth control/protection: None  ?Other Topics Concern  ? Not on file  ?Social History Narrative  ? Not on file  ? ?Social Determinants of Health  ? ?Financial Resource Strain: Not on file  ?Food Insecurity: Not on file  ?Transportation Needs: Not on file  ?Physical Activity: Not on file  ?Stress: Not on file  ?Social Connections: Not on file  ?Intimate Partner Violence: Not on file  ? ? ?Past Surgical History:  ?Procedure Laterality Date  ? WISDOM TOOTH EXTRACTION  2004  ? ? ?Family History  ?Problem Relation Age of Onset  ? Stroke Mother   ? Miscarriages /  [place] Mother   ? Cancer Father   ? ? ?No Known Allergies ? ?No current outpatient medications on file prior to visit.  ? ?No current facility-administered medications on file prior to visit.  ? ? ?BP 112/65   Pulse 69   Temp 98.4 ?F (36.9 ?C)   Resp 18   Ht 5\' 6"  (1.676 m)   Wt 157 lb (71.2 kg)   SpO2 100%   BMI 25.34 kg/m?  ?  ?   ?Objective:  ? Physical Exam ? ?General- No acute distress. Pleasant patient. ?Neck- Full range of motion, no jvd ?Lungs- Clear, even and unlabored. ?Heart- regular rate and rhythm. ?Neurologic- CNII- XII grossly intact.  ? ?Heent- mild maxillary sinus pressure. Canals clear and nml tim. ?Mild tender submandibular nodes. Mild posterior rpharynx redness. ? ?   ?Assessment & Plan:  ? ?Patient Instructions  ?Pharyngitis with strep exposure to your children with strep throat. Your rapid test is negative but will treat for strep clinically.  ? ?Rx azithromycin antibiotic ? ?For nasal congestion rx flonase. ? ?Follow up in 7-10 days or sooner if needed. ? ?  ? ?9-10, PA-C  ?

## 2021-06-04 DIAGNOSIS — D229 Melanocytic nevi, unspecified: Secondary | ICD-10-CM | POA: Diagnosis not present

## 2021-06-04 DIAGNOSIS — L821 Other seborrheic keratosis: Secondary | ICD-10-CM | POA: Diagnosis not present

## 2021-06-04 DIAGNOSIS — X32XXXS Exposure to sunlight, sequela: Secondary | ICD-10-CM | POA: Diagnosis not present

## 2021-06-04 DIAGNOSIS — L814 Other melanin hyperpigmentation: Secondary | ICD-10-CM | POA: Diagnosis not present

## 2021-06-04 DIAGNOSIS — L603 Nail dystrophy: Secondary | ICD-10-CM | POA: Diagnosis not present

## 2021-06-20 DIAGNOSIS — Z6825 Body mass index (BMI) 25.0-25.9, adult: Secondary | ICD-10-CM | POA: Diagnosis not present

## 2021-06-20 DIAGNOSIS — Z01419 Encounter for gynecological examination (general) (routine) without abnormal findings: Secondary | ICD-10-CM | POA: Diagnosis not present

## 2021-06-22 DIAGNOSIS — Z79899 Other long term (current) drug therapy: Secondary | ICD-10-CM | POA: Diagnosis not present

## 2021-06-22 DIAGNOSIS — L603 Nail dystrophy: Secondary | ICD-10-CM | POA: Diagnosis not present

## 2021-07-12 DIAGNOSIS — L84 Corns and callosities: Secondary | ICD-10-CM | POA: Diagnosis not present

## 2021-08-08 DIAGNOSIS — Z79899 Other long term (current) drug therapy: Secondary | ICD-10-CM | POA: Diagnosis not present

## 2021-08-08 DIAGNOSIS — L603 Nail dystrophy: Secondary | ICD-10-CM | POA: Diagnosis not present

## 2021-08-24 ENCOUNTER — Telehealth: Payer: Self-pay | Admitting: Medical

## 2021-08-24 NOTE — Telephone Encounter (Signed)
Patient dropped of form to be filled out Place in bin up front Patient would like to be called when its ready for pick up

## 2021-08-24 NOTE — Telephone Encounter (Signed)
Form picked up and placed in provider folder

## 2021-08-24 NOTE — Telephone Encounter (Signed)
Patient filled out an ROI to get other immunization on file so we could complete

## 2021-08-28 ENCOUNTER — Telehealth: Payer: Self-pay | Admitting: Medical

## 2021-08-28 NOTE — Telephone Encounter (Signed)
Patient doesn't have records in [facility] or [facility]    Can you place orders for titers

## 2021-08-28 NOTE — Telephone Encounter (Signed)
Pt called and lvm to return call to schedule an appt 

## 2021-08-28 NOTE — Telephone Encounter (Signed)
Would you offer/ask pt to come in for wellness exam. She dropped of form. No vaccines. What program is she going to study. Will they want physical done in near future if so best to have cpe on file. Also want to discuss her trying to get old records  from health dept where she grew up. Need to discuss this form in detail on form it states titer not accepted for hep b? Also does she need ppd or tb blood test. In short better to see her for exam to discuss. I have only seen her twice as well.

## 2021-08-29 NOTE — Telephone Encounter (Signed)
Patient returned call. Advised her that it was to schedule an appointment. She didn't want to schedule an annual visit. Patient was trying to wait until we received her immunization records from her other providers so she knows what she still needs to take because she isn't sure of all that she needs. Patient did schedule an appointment to go over form.

## 2021-08-29 NOTE — Telephone Encounter (Signed)
Pt was called and lvm to schedule an appt on previous note

## 2021-09-05 ENCOUNTER — Ambulatory Visit: Payer: BC Managed Care – PPO | Admitting: Medical

## 2021-09-05 VITALS — BP 118/62 | HR 78 | Resp 18 | Ht 66.0 in | Wt 156.2 lb

## 2021-09-05 DIAGNOSIS — Z111 Encounter for screening for respiratory tuberculosis: Secondary | ICD-10-CM

## 2021-09-05 DIAGNOSIS — Z800 Family history of malignant neoplasm of digestive organs: Secondary | ICD-10-CM

## 2021-09-05 DIAGNOSIS — Z1211 Encounter for screening for malignant neoplasm of colon: Secondary | ICD-10-CM

## 2021-09-05 DIAGNOSIS — Z0000 Encounter for general adult medical examination without abnormal findings: Secondary | ICD-10-CM

## 2021-09-05 NOTE — Patient Instructions (Addendum)
For you wellness exam today I have ordered cbc, cmp and  lipid panel. Placing as future order to get done fasting in early am.  Vaccine up to date. Flu vaccine can get done in sept or October. Sooner if very early flu season.  Up to date on pap.  Recommend exercise and healthy diet.  We will let you know lab results as they come in.  Follow up date appointment will be determined after lab review.    If your ear continue to feel pressure then let me know could rx cortisporin otic and you could add on flonase.  Ask you send me copies of prior vaccine documents.    Preventive Care 77-11 Years Old, Female Preventive care refers to lifestyle choices and visits with your health care provider that can promote health and wellness. Preventive care visits are also called wellness exams. What can I expect for my preventive care visit? Counseling During your preventive care visit, your health care provider may ask about your: Medical history, including: Past medical problems. Family medical history. Pregnancy history. Current health, including: Menstrual cycle. Method of birth control. Emotional well-being. Home life and relationship well-being. Sexual activity and sexual health. Lifestyle, including: Alcohol, nicotine or tobacco, and drug use. Access to firearms. Diet, exercise, and sleep habits. Work and work Statistician. Sunscreen use. Safety issues such as seatbelt and bike helmet use. Physical exam Your health care provider may check your: Height and weight. These may be used to calculate your BMI (body mass index). BMI is a measurement that tells if you are at a healthy weight. Waist circumference. This measures the distance around your waistline. This measurement also tells if you are at a healthy weight and may help predict your risk of certain diseases, such as type 2 diabetes and high blood pressure. Heart rate and blood pressure. Body temperature. Skin for abnormal  spots. What immunizations do I need?  Vaccines are usually given at various ages, according to a schedule. Your health care provider will recommend vaccines for you based on your age, medical history, and lifestyle or other factors, such as travel or where you work. What tests do I need? Screening Your health care provider may recommend screening tests for certain conditions. This may include: Pelvic exam and Pap test. Lipid and cholesterol levels. Diabetes screening. This is done by checking your blood sugar (glucose) after you have not eaten for a while (fasting). Hepatitis B test. Hepatitis C test. HIV (human immunodeficiency virus) test. STI (sexually transmitted infection) testing, if you are at risk. BRCA-related cancer screening. This may be done if you have a family history of breast, ovarian, tubal, or peritoneal cancers. Talk with your health care provider about your test results, treatment options, and if necessary, the need for more tests. Follow these instructions at home: Eating and drinking  Eat a healthy diet that includes fresh fruits and vegetables, whole grains, lean protein, and low-fat dairy products. Take vitamin and mineral supplements as recommended by your health care provider. Do not drink alcohol if: Your health care provider tells you not to drink. You are pregnant, may be pregnant, or are planning to become pregnant. If you drink alcohol: Limit how much you have to 0-1 drink a day. Know how much alcohol is in your drink. In the [place] one drink equals one 12 oz bottle of beer (355 mL), one 5 oz glass of wine (148 mL), or one 1 oz glass of hard liquor (44 mL). Lifestyle Brush your teeth  every morning and night with fluoride toothpaste. Floss one time each day. Exercise for at least 30 minutes 5 or more days each week. Do not use any products that contain nicotine or tobacco. These products include cigarettes, chewing tobacco, and vaping devices, such as  e-cigarettes. If you need help quitting, ask your health care provider. Do not use drugs. If you are sexually active, practice safe sex. Use a condom or other form of protection to prevent STIs. If you do not wish to become pregnant, use a form of birth control. If you plan to become pregnant, see your health care provider for a prepregnancy visit. Find healthy ways to manage stress, such as: Meditation, yoga, or listening to music. Journaling. Talking to a trusted person. Spending time with friends and family. Minimize exposure to UV radiation to reduce your risk of skin cancer. Safety Always wear your seat belt while driving or riding in a vehicle. Do not drive: If you have been drinking alcohol. Do not ride with someone who has been drinking. If you have been using any mind-altering substances or drugs. While texting. When you are tired or distracted. Wear a helmet and other protective equipment during sports activities. If you have firearms in your house, make sure you follow all gun safety procedures. Seek help if you have been physically or sexually abused. What's next? Go to your health care provider once a year for an annual wellness visit. Ask your health care provider how often you should have your eyes and teeth checked. Stay up to date on all vaccines. This information is not intended to replace advice given to you by your health care provider. Make sure you discuss any questions you have with your health care provider. Document Revised: 07/19/2020 Document Reviewed: 07/19/2020 Elsevier Patient Education  [place].

## 2021-09-05 NOTE — Progress Notes (Signed)
Subjective:    Patient ID: Carolyn Hill, female    DOB: 1980/12/30, 41 y.o.   MRN: [id number]  HPI  Pt in for evaluation. Discussed can go ahead and do wellness exam.  Pre- requisites to study speech pathology. Studying at [employer]. Then will apply to speech pathology program in one year.  Has been exercising. Healthy diet overall. Non smoker. Alcohol occasiosal alcohol.  1-2 beverage a week on average.  Pt grew up in [place] outside of [place].   Pt had tdap in 2015. She thinks maybe maybe one in 2018 thru her ob. She states can see that on line.   She also has paper.  Pt has some old records.  Pap up to date.      Review of Systems  Constitutional:  Negative for chills, fatigue and fever.  HENT:  Negative for congestion, drooling and hearing loss.        Mild sensation of something in ear. On vacation swimming and flying.  Respiratory:  Negative for cough, chest tightness, shortness of breath and wheezing.   Cardiovascular:  Negative for chest pain and palpitations.  Gastrointestinal:  Negative for abdominal pain, blood in stool and diarrhea.  Genitourinary:  Negative for difficulty urinating, dysuria, flank pain and frequency.  Musculoskeletal:  Negative for back pain and joint swelling.  Skin:  Negative for rash.    No past medical history on file.   Social History   Socioeconomic History   Marital status: Married    Spouse name: Not on file   Number of children: Not on file   Years of education: Not on file   Highest education level: Not on file  Occupational History   Not on file  Tobacco Use   Smoking status: Never   Smokeless tobacco: Never  Vaping Use   Vaping Use: Never used  Substance and Sexual Activity   Alcohol use: Yes    Comment: 1 glass of wine about 1-2 times a week.   Drug use: No   Sexual activity: Not Currently    Birth control/protection: None  Other Topics Concern   Not on file  Social History Narrative   Not on file   Social  Determinants of Health   Financial Resource Strain: Not on file  Food Insecurity: Not on file  Transportation Needs: Not on file  Physical Activity: Not on file  Stress: Not on file  Social Connections: Not on file  Intimate Partner Violence: Not on file    Past Surgical History:  Procedure Laterality Date   WISDOM TOOTH EXTRACTION  2004    Family History  Problem Relation Age of Onset   Stroke Mother    Miscarriages / [place] Mother    Cancer Father     No Known Allergies  Current Outpatient Medications on File Prior to Visit  Medication Sig Dispense Refill   terbinafine (LAMISIL) 250 MG tablet Take 250 mg by mouth daily.     No current facility-administered medications on file prior to visit.    BP 118/62   Pulse 78   Resp 18   Ht 5\' 6"  (1.676 m)   Wt 156 lb 3.2 oz (70.9 kg)   LMP 08/24/2021   SpO2 100%   BMI 25.21 kg/m        Objective:   Physical Exam   General Mental Status- Alert. General Appearance- Not in acute distress.   Skin General: Color- Normal Color. Moisture- Normal Moisture.  Neck Carotid Arteries- Normal color. Moisture-  Normal Moisture. No carotid bruits. No JVD.  Chest and Lung Exam Auscultation: Breath Sounds:-Normal.  Cardiovascular Auscultation:Rythm- Regular. Murmurs & Other Heart Sounds:Auscultation of the heart reveals- No Murmurs.  Abdomen Inspection:-Inspeection Normal. Palpation/Percussion:Note:No mass. Palpation and Percussion of the abdomen reveal- Non Tender, Non Distended + BS, no rebound or guarding.   Neurologic Cranial Nerve exam:- CN III-XII intact(No nystagmus), symmetric smile. Strength:- 5/5 equal and symmetric strength both upper and lower extremities.      Assessment & Plan:  For you wellness exam today I have ordered cbc, cmp and  lipid panel. Placing as future order to get done fasting in early am.  Vaccine up to date. Flu vaccine can get done in sept or October. Sooner if very early flu  season.  Up to date on pap.  Recommend exercise and healthy diet.  We will let you know lab results as they come in.  Follow up date appointment will be determined after lab review.    If your ear continue to feel pressure then let me know could rx cortisporin otic and you could add on flonase.  Ask you send me copies of prior vaccine documents.   Esperanza Richters, PA-C

## 2021-09-11 ENCOUNTER — Other Ambulatory Visit (INDEPENDENT_AMBULATORY_CARE_PROVIDER_SITE_OTHER): Payer: BC Managed Care – PPO

## 2021-09-11 DIAGNOSIS — Z111 Encounter for screening for respiratory tuberculosis: Secondary | ICD-10-CM

## 2021-09-11 DIAGNOSIS — Z0000 Encounter for general adult medical examination without abnormal findings: Secondary | ICD-10-CM | POA: Diagnosis not present

## 2021-09-14 LAB — QUANTIFERON-TB GOLD PLUS
Mitogen-NIL: >10 IU/mL
NIL: 0.05 IU/mL
QuantiFERON-TB Gold Plus: NEGATIVE
TB1-NIL: 0.03 IU/mL
TB2-NIL: 0 IU/mL

## 2021-09-20 ENCOUNTER — Encounter: Payer: Self-pay | Admitting: Medical

## 2021-09-26 DIAGNOSIS — L603 Nail dystrophy: Secondary | ICD-10-CM | POA: Diagnosis not present

## 2021-09-26 DIAGNOSIS — Z79899 Other long term (current) drug therapy: Secondary | ICD-10-CM | POA: Diagnosis not present

## 2022-05-17 DIAGNOSIS — H5213 Myopia, bilateral: Secondary | ICD-10-CM | POA: Diagnosis not present

## 2022-05-17 DIAGNOSIS — H10413 Chronic giant papillary conjunctivitis, bilateral: Secondary | ICD-10-CM | POA: Diagnosis not present

## 2022-05-17 DIAGNOSIS — H52203 Unspecified astigmatism, bilateral: Secondary | ICD-10-CM | POA: Diagnosis not present

## 2022-06-05 DIAGNOSIS — L814 Other melanin hyperpigmentation: Secondary | ICD-10-CM | POA: Diagnosis not present

## 2022-06-05 DIAGNOSIS — D229 Melanocytic nevi, unspecified: Secondary | ICD-10-CM | POA: Diagnosis not present

## 2022-06-05 DIAGNOSIS — L821 Other seborrheic keratosis: Secondary | ICD-10-CM | POA: Diagnosis not present

## 2022-06-26 DIAGNOSIS — Z01419 Encounter for gynecological examination (general) (routine) without abnormal findings: Secondary | ICD-10-CM | POA: Diagnosis not present

## 2022-06-26 DIAGNOSIS — Z1231 Encounter for screening mammogram for malignant neoplasm of breast: Secondary | ICD-10-CM | POA: Diagnosis not present

## 2022-06-26 DIAGNOSIS — Z6825 Body mass index (BMI) 25.0-25.9, adult: Secondary | ICD-10-CM | POA: Diagnosis not present

## 2022-07-03 ENCOUNTER — Other Ambulatory Visit: Payer: Self-pay | Admitting: Obstetrics

## 2022-07-03 DIAGNOSIS — R928 Other abnormal and inconclusive findings on diagnostic imaging of breast: Secondary | ICD-10-CM

## 2022-07-10 ENCOUNTER — Ambulatory Visit (INDEPENDENT_AMBULATORY_CARE_PROVIDER_SITE_OTHER): Payer: BC Managed Care – PPO | Admitting: Medical

## 2022-07-10 VITALS — BP 118/56 | HR 70 | Temp 98.0°F | Resp 18 | Ht 66.0 in | Wt 158.0 lb

## 2022-07-10 DIAGNOSIS — Z111 Encounter for screening for respiratory tuberculosis: Secondary | ICD-10-CM

## 2022-07-10 DIAGNOSIS — Z0000 Encounter for general adult medical examination without abnormal findings: Secondary | ICD-10-CM | POA: Diagnosis not present

## 2022-07-10 LAB — CBC WITH DIFFERENTIAL/PLATELET
Basophils Absolute: 0 10*3/uL (ref 0.0–0.1)
Basophils Relative: 0.5 % (ref 0.0–3.0)
Eosinophils Absolute: 0.2 10*3/uL (ref 0.0–0.7)
Eosinophils Relative: 3 % (ref 0.0–5.0)
HCT: 37.6 % (ref 36.0–46.0)
Hemoglobin: 12.2 g/dL (ref 12.0–15.0)
Lymphocytes Relative: 27.2 % (ref 12.0–46.0)
Lymphs Abs: 2 10*3/uL (ref 0.7–4.0)
MCHC: 32.5 g/dL (ref 30.0–36.0)
MCV: 88 fl (ref 78.0–100.0)
Monocytes Absolute: 0.6 10*3/uL (ref 0.1–1.0)
Monocytes Relative: 8.1 % (ref 3.0–12.0)
Neutro Abs: 4.4 10*3/uL (ref 1.4–7.7)
Neutrophils Relative %: 61.2 % (ref 43.0–77.0)
Platelets: 302 10*3/uL (ref 150.0–400.0)
RBC: 4.27 Mil/uL (ref 3.87–5.11)
RDW: 14.8 % (ref 11.5–15.5)
WBC: 7.2 10*3/uL (ref 4.0–10.5)

## 2022-07-10 LAB — COMPREHENSIVE METABOLIC PANEL
ALT: 42 U/L — ABNORMAL HIGH (ref 0–35)
AST: 41 U/L — ABNORMAL HIGH (ref 0–37)
Albumin: 4.4 g/dL (ref 3.5–5.2)
Alkaline Phosphatase: 86 U/L (ref 39–117)
BUN: 14 mg/dL (ref 6–23)
CO2: 24 mEq/L (ref 19–32)
Calcium: 9.4 mg/dL (ref 8.4–10.5)
Chloride: 104 mEq/L (ref 96–112)
Creatinine, Ser: 0.71 mg/dL (ref 0.40–1.20)
GFR: 106.23 mL/min (ref >60.00)
Glucose, Bld: 95 mg/dL (ref 70–99)
Potassium: 4.3 mEq/L (ref 3.5–5.1)
Sodium: 140 mEq/L (ref 135–145)
Total Bilirubin: 0.5 mg/dL (ref 0.2–1.2)
Total Protein: 7 g/dL (ref 6.0–8.3)

## 2022-07-10 LAB — LIPID PANEL
Cholesterol: 196 mg/dL (ref 0–200)
HDL: 58.9 mg/dL (ref >39.00)
LDL Cholesterol: 119 mg/dL — ABNORMAL HIGH (ref 0–99)
NonHDL: 137.43
Total CHOL/HDL Ratio: 3
Triglycerides: 92 mg/dL (ref 0.0–149.0)
VLDL: 18.4 mg/dL (ref 0.0–40.0)

## 2022-07-10 NOTE — Progress Notes (Signed)
Subjective:    Patient ID: Carolyn Hill, female    DOB: 12/14/1980, 42 y.o.   MRN: [id number]  HPI In for wellness exam. Also needs form for Master program at [employer].   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  Has been exercising. Healthy diet overall. Non smoker. Alcohol occasiosal alcohol. 1-2 beverage a month on average.   Vaccines are up to date.  Up to date on mammogram- has additional studies scheduled for 07-15-2022. Up to date on papsmear.   Labs to include tb screening.  Review of Systems  Constitutional:  Negative for chills, fatigue and fever.  HENT:  Negative for congestion, drooling and hearing loss.   Respiratory:  Negative for cough, chest tightness, shortness of breath and wheezing.   Cardiovascular:  Negative for chest pain and palpitations.  Gastrointestinal:  Negative for abdominal pain, blood in stool and diarrhea.  Genitourinary:  Negative for difficulty urinating, dysuria, flank pain and frequency.  Musculoskeletal:  Negative for back pain and joint swelling.  Skin:  Negative for rash.  Neurological:  Negative for dizziness, weakness, numbness and headaches.  Hematological:  Negative for adenopathy. Does not bruise/bleed easily.  Psychiatric/Behavioral:  Negative for behavioral problems and decreased concentration.     No past medical history on file.   Social History   Socioeconomic History   Marital status: Married    Spouse name: Not on file   Number of children: Not on file   Years of education: Not on file   Highest education level: Not on file  Occupational History   Not on file  Tobacco Use   Smoking status: Never   Smokeless tobacco: Never  Vaping Use   Vaping Use: Never used  Substance and Sexual Activity   Alcohol use: Yes    Comment: 1 glass of wine about 1-2 times a week.   Drug use: No   Sexual activity: Not Currently    Birth control/protection: None  Other Topics Concern   Not on file  Social History  Narrative   Not on file   Social Determinants of Health   Financial Resource Strain: Not on file  Food Insecurity: Not on file  Transportation Needs: Not on file  Physical Activity: Not on file  Stress: Not on file  Social Connections: Not on file  Intimate Partner Violence: Not on file    Past Surgical History:  Procedure Laterality Date   WISDOM TOOTH EXTRACTION  2004    Family History  Problem Relation Age of Onset   Stroke Mother    Miscarriages / [place] Mother    Cancer Father     No Known Allergies  No current outpatient medications on file prior to visit.   No current facility-administered medications on file prior to visit.    BP (!) 118/56   Pulse 70   Temp 98 F (36.7 C)   Resp 18   Ht 5\' 6"  (1.676 m)   Wt 158 lb (71.7 kg)   LMP 07/09/2022   SpO2 100%   BMI 25.50 kg/m        Objective:   Physical Exam  General Mental Status- Alert. General Appearance- Not in acute distress.   Skin General: Color- Normal Color. Moisture- Normal Moisture.  Neck Carotid Arteries- Normal color. Moisture- Normal Moisture. No carotid bruits. No JVD.  Chest and Lung Exam Auscultation: Breath Sounds:-Normal.  Cardiovascular Auscultation:Rythm- Regular. Murmurs & Other Heart Sounds:Auscultation of the heart reveals- No Murmurs.  Abdomen Inspection:-Inspeection Normal.  Palpation/Percussion:Note:No mass. Palpation and Percussion of the abdomen reveal- Non Tender, Non Distended + BS, no rebound or guarding.   Neurologic Cranial Nerve exam:- CN III-XII intact(No nystagmus), symmetric smile. Strength:- 5/5 equal and symmetric strength both upper and lower extremities.        Assessment & Plan:   Patient Instructions  For you wellness exam today I have ordered cbc, cmp, tb screening  and lipid panel.  Vaccines up to date.  Mammogram and papsmear up to date.  Recommend exercise and healthy diet.  We will let you know lab results as they come  in.  Follow up date appointment will be determined after lab review.      Esperanza Richters, PA-C

## 2022-07-10 NOTE — Patient Instructions (Addendum)
For you wellness exam today I have ordered cbc, cmp, tb screening  and lipid panel.  Vaccines up to date.  Mammogram and papsmear up to date.  Recommend exercise and healthy diet.  We will let you know lab results as they come in.  Follow up date appointment will be determined after lab review.     Preventive Care 41-41 Years Old, Female Preventive care refers to lifestyle choices and visits with your health care provider that can promote health and wellness. Preventive care visits are also called wellness exams. What can I expect for my preventive care visit? Counseling Your health care provider may ask you questions about your: Medical history, including: Past medical problems. Family medical history. Pregnancy history. Current health, including: Menstrual cycle. Method of birth control. Emotional well-being. Home life and relationship well-being. Sexual activity and sexual health. Lifestyle, including: Alcohol, nicotine or tobacco, and drug use. Access to firearms. Diet, exercise, and sleep habits. Work and work Astronomer. Sunscreen use. Safety issues such as seatbelt and bike helmet use. Physical exam Your health care provider will check your: Height and weight. These may be used to calculate your BMI (body mass index). BMI is a measurement that tells if you are at a healthy weight. Waist circumference. This measures the distance around your waistline. This measurement also tells if you are at a healthy weight and may help predict your risk of certain diseases, such as type 2 diabetes and high blood pressure. Heart rate and blood pressure. Body temperature. Skin for abnormal spots. What immunizations do I need?  Vaccines are usually given at various ages, according to a schedule. Your health care provider will recommend vaccines for you based on your age, medical history, and lifestyle or other factors, such as travel or where you work. What tests do I  need? Screening Your health care provider may recommend screening tests for certain conditions. This may include: Lipid and cholesterol levels. Diabetes screening. This is done by checking your blood sugar (glucose) after you have not eaten for a while (fasting). Pelvic exam and Pap test. Hepatitis B test. Hepatitis C test. HIV (human immunodeficiency virus) test. STI (sexually transmitted infection) testing, if you are at risk. Lung cancer screening. Colorectal cancer screening. Mammogram. Talk with your health care provider about when you should start having regular mammograms. This may depend on whether you have a family history of breast cancer. BRCA-related cancer screening. This may be done if you have a family history of breast, ovarian, tubal, or peritoneal cancers. Bone density scan. This is done to screen for osteoporosis. Talk with your health care provider about your test results, treatment options, and if necessary, the need for more tests. Follow these instructions at home: Eating and drinking  Eat a diet that includes fresh fruits and vegetables, whole grains, lean protein, and low-fat dairy products. Take vitamin and mineral supplements as recommended by your health care provider. Do not drink alcohol if: Your health care provider tells you not to drink. You are pregnant, may be pregnant, or are planning to become pregnant. If you drink alcohol: Limit how much you have to 0-1 drink a day. Know how much alcohol is in your drink. In the [place] one drink equals one 12 oz bottle of beer (355 mL), one 5 oz glass of wine (148 mL), or one 1 oz glass of hard liquor (44 mL). Lifestyle Brush your teeth every morning and night with fluoride toothpaste. Floss one time each day. Exercise for at least  30 minutes 5 or more days each week. Do not use any products that contain nicotine or tobacco. These products include cigarettes, chewing tobacco, and vaping devices, such as  e-cigarettes. If you need help quitting, ask your health care provider. Do not use drugs. If you are sexually active, practice safe sex. Use a condom or other form of protection to prevent STIs. If you do not wish to become pregnant, use a form of birth control. If you plan to become pregnant, see your health care provider for a prepregnancy visit. Take aspirin only as told by your health care provider. Make sure that you understand how much to take and what form to take. Work with your health care provider to find out whether it is safe and beneficial for you to take aspirin daily. Find healthy ways to manage stress, such as: Meditation, yoga, or listening to music. Journaling. Talking to a trusted person. Spending time with friends and family. Minimize exposure to UV radiation to reduce your risk of skin cancer. Safety Always wear your seat belt while driving or riding in a vehicle. Do not drive: If you have been drinking alcohol. Do not ride with someone who has been drinking. When you are tired or distracted. While texting. If you have been using any mind-altering substances or drugs. Wear a helmet and other protective equipment during sports activities. If you have firearms in your house, make sure you follow all gun safety procedures. Seek help if you have been physically or sexually abused. What's next? Visit your health care provider once a year for an annual wellness visit. Ask your health care provider how often you should have your eyes and teeth checked. Stay up to date on all vaccines. This information is not intended to replace advice given to you by your health care provider. Make sure you discuss any questions you have with your health care provider. Document Revised: 07/19/2020 Document Reviewed: 07/19/2020 Elsevier Patient Education  2024 [employer].

## 2022-07-13 LAB — QUANTIFERON-TB GOLD PLUS
Mitogen-NIL: >10 IU/mL
NIL: 0.08 IU/mL
QuantiFERON-TB Gold Plus: NEGATIVE
TB1-NIL: <0 IU/mL
TB2-NIL: <0 IU/mL

## 2022-07-15 ENCOUNTER — Ambulatory Visit
Admission: RE | Admit: 2022-07-15 | Discharge: 2022-07-15 | Disposition: A | Discharge status: Home / Self Care | Payer: BC Managed Care – PPO | Source: Ambulatory Visit | Attending: Obstetrics | Admitting: Obstetrics

## 2022-07-15 DIAGNOSIS — R92332 Mammographic heterogeneous density, left breast: Secondary | ICD-10-CM | POA: Diagnosis not present

## 2022-07-15 DIAGNOSIS — R928 Other abnormal and inconclusive findings on diagnostic imaging of breast: Secondary | ICD-10-CM

## 2022-07-15 DIAGNOSIS — R922 Inconclusive mammogram: Secondary | ICD-10-CM | POA: Diagnosis not present

## 2022-07-20 ENCOUNTER — Telehealth: Payer: Self-pay | Admitting: Medical

## 2022-07-20 NOTE — Telephone Encounter (Signed)
Filled out [facility] physical exam form. Have pt pick up form or fax.

## 2022-07-22 NOTE — Telephone Encounter (Signed)
Pt.notified

## 2023-03-18 ENCOUNTER — Encounter: Payer: Self-pay | Admitting: Medical

## 2023-03-24 NOTE — Telephone Encounter (Signed)
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 Please fax.

## 2023-04-29 ENCOUNTER — Telehealth: Payer: Self-pay

## 2023-04-29 NOTE — Telephone Encounter (Signed)
 Copied from CRM [id number]. Topic: Clinical - Medical Advice >> Apr 29, 2023 10:44 AM Fonda Kinder J wrote: Reason for CRM: Pt states her father has colon cancer and was advised it may run in the family. The pt is requesting to have a colonoscopy done and wants to know the steps she needs to take to have that done. Please advise

## 2023-04-29 NOTE — Telephone Encounter (Signed)
 Pt notified via mychart

## 2023-04-29 NOTE — Addendum Note (Signed)
 Addended by: Gwenevere Abbot on: 04/29/2023 12:45 PM   Modules accepted: Orders

## 2023-05-09 ENCOUNTER — Encounter: Payer: Self-pay | Admitting: Pediatrics

## 2023-06-13 ENCOUNTER — Other Ambulatory Visit (INDEPENDENT_AMBULATORY_CARE_PROVIDER_SITE_OTHER): Payer: PRIVATE HEALTH INSURANCE | Admitting: Medical

## 2023-06-13 ENCOUNTER — Other Ambulatory Visit: Payer: Self-pay

## 2023-06-13 DIAGNOSIS — Z0184 Encounter for antibody response examination: Secondary | ICD-10-CM | POA: Diagnosis not present

## 2023-06-13 DIAGNOSIS — Z111 Encounter for screening for respiratory tuberculosis: Secondary | ICD-10-CM

## 2023-06-15 ENCOUNTER — Encounter: Payer: Self-pay | Admitting: Medical

## 2023-06-17 ENCOUNTER — Ambulatory Visit: Payer: Self-pay | Admitting: Family

## 2023-06-17 LAB — MEASLES/MUMPS/RUBELLA IMMUNITY
Mumps IgG: 157 [AU]/ml
Rubella: 5.97 {index}
Rubeola IgG: 255 [AU]/ml

## 2023-06-17 LAB — QUANTIFERON-TB GOLD PLUS
Mitogen-NIL: 6.5 [IU]/mL
NIL: 0.02 [IU]/mL
QuantiFERON-TB Gold Plus: NEGATIVE
TB1-NIL: 0 [IU]/mL
TB2-NIL: 0 [IU]/mL

## 2023-06-17 LAB — VARICELLA-ZOSTER VIRUS AB(IMMUNITY SCREEN),ACIF,SERUM: VARICELLA ZOSTER VIRUS AB (IMMUNITY SCR),ACIF SERUM: >=1:4 {titer}

## 2023-07-04 ENCOUNTER — Ambulatory Visit: Admitting: Pediatrics

## 2023-08-10 NOTE — Progress Notes (Unsigned)
 [facility] Initial Consultation   Referring Provider Dorina [place] RIGGERS [address] [place],  [place] [address]  Primary Care Provider Saguier, [place], [place]  Patient Profile: Juanell Saffo is a 43 y.o. female who is seen in consultation in the [facility] at the request of Dr. Saguier for evaluation and management of the problem(s) noted below.  Problem List: Family history of colorectal cancer in first-degree relative-father Family history of colon polyps-brother Elevated hepatic transaminases in 2024  History of Present Illness   Ms. Lavigne is a 43 y.o. female without any significant past medical history who presents to the office for discussion regarding colonoscopy given family history of colorectal cancer in a first-degree relative as well as a family history of colon polyps.  Jya reports that her father was diagnosed with colorectal cancer in his 47s; he underwent surgery but did not not require chemoradiation and he is doing well now in his 42s Her older brother had a colonoscopy which revealed 1 colon polyp She has a younger brother who has not yet had a colonoscopy  She has not had a colonoscopy for any reason in her lifetime Bowel movements are regular No report of diarrhea, constipation, melena or hematochezia No personal history of inflammatory bowel disease or irritable bowel syndrome  No previous history of gastrointestinal surgeries  Weight, energy and appetite are stable  Chart review discloses mild elevation of hepatic transaminases 07/10/2022 with AST 41 and ALT 42 These were drawn in the context of an annual wellness exam   Last colonoscopy: None Last endoscopy: None  Last Abd CT/CTE/MRE: None  GI Review of Symptoms Significant for none. Otherwise negative.  General Review of Systems  Review of systems is significant for the pertinent positives and negatives as listed per the HPI.  Full ROS is otherwise  negative.  Past Medical History   Past Medical History:  Diagnosis Date   HLD (hyperlipidemia)      Past Surgical History   Past Surgical History:  Procedure Laterality Date   WISDOM TOOTH EXTRACTION  2004     Allergies and Medications   No Known Allergies    Current Meds  Medication Sig   Cyanocobalamin (VITAMIN B-12 PO) Take 1 tablet by mouth as needed.   Na Sulfate-K Sulfate-Mg Sulfate concentrate (SUPREP) 17.5-3.13-1.6 GM/177ML SOLN Take 1 kit (354 mLs total) by mouth once for 1 dose.    Family History   Family History  Problem Relation Age of Onset   Stroke Mother    Miscarriages / [place] Mother    Anemia Mother    Colon cancer Father    Colon polyps Father    Fuch's dystrophy Father    Hyperlipidemia Father      Social History   Social History   Tobacco Use   Smoking status: Never   Smokeless tobacco: Never  Vaping Use   Vaping status: Never Used  Substance Use Topics   Alcohol use: Yes    Comment: 1 glass of wine about 1-2 times a week.   Drug use: No   [place] reports that she has never smoked. She has never used smokeless tobacco. She reports current alcohol use. She reports that she does not use drugs.  Vital Signs and Physical Examination   Vitals:   08/12/23 1042  BP: 100/80  Pulse: 76  Height: 5' 6.25 (1.683 m) Comment: height measured without shoes  Weight: 155 lb 8 oz (70.5 kg)  BMI (Calculated): 24.9  General: Well developed, well nourished, no acute distress Head: Normocephalic and atraumatic Eyes: Sclerae anicteric, EOMI Lungs: Clear throughout to auscultation Heart: Regular rate and rhythm; No murmurs, rubs or bruits Abdomen: Soft, non tender and non distended. No masses, hepatosplenomegaly or hernias noted. Normal Bowel sounds Rectal: Deferred Musculoskeletal: Symmetrical with no gross deformities   Review of Data  The following data was reviewed at the time of this encounter:  Laboratory Studies      Latest  Ref Rng & Units 07/10/2022   10:54 AM 05/03/2020    7:55 AM 06/08/2016    5:16 AM  CBC  WBC 4.0 - 10.5 K/uL 7.2  6.9  12.8   Hemoglobin 12.0 - 15.0 g/dL 87.7  86.2  89.8   Hematocrit 36.0 - 46.0 % 37.6  39.9  29.5   Platelets 150.0 - 400.0 K/uL 302.0  269.0  183     No results found for: LIPASE    Latest Ref Rng & Units 07/10/2022   10:54 AM 05/03/2020    7:55 AM  CMP  Glucose 70 - 99 mg/dL 95  94   BUN 6 - 23 mg/dL 14  14   Creatinine 9.59 - 1.20 mg/dL 9.28  9.20   Sodium 864 - 145 mEq/L 140  140   Potassium 3.5 - 5.1 mEq/L 4.3  3.9   Chloride 96 - 112 mEq/L 104  104   CO2 19 - 32 mEq/L 24  29   Calcium 8.4 - 10.5 mg/dL 9.4  9.5   Total Protein 6.0 - 8.3 g/dL 7.0  7.1   Total Bilirubin 0.2 - 1.2 mg/dL 0.5  0.7   Alkaline Phos 39 - 117 U/L 86  60   AST 0 - 37 U/L 41  18   ALT 0 - 35 U/L 42  14     Imaging Studies  None  GI Procedures and Studies  None   Clinical Impression  It is my clinical impression that Ms. Risse is a 43 y.o. female with;  Family history of colorectal cancer in first-degree relative-father Family history of colon polyps-brother Elevated hepatic transaminases in 2024  Verdis presents to the office today to discuss initiation of colorectal cancer screening given a family history of colorectal cancer and colon polyps.  Her father was diagnosed with colorectal cancer in his 4s.  He underwent surgery but did not require chemoradiation and is now doing well in his 99s.  Her older brother has had a colonoscopy which disclosed 1 polyp.  We reviewed current guidelines which advise initiating colorectal cancer screening at the age of 20 when there is a first-degree family member diagnosed with colorectal cancer at the age of 71 or younger.  As such, it is appropriate for [facility] to begin colorectal cancer screening with colonoscopy.  We reviewed potential risks of anesthesia, aspiration, perforation and bleeding.  She is amenable to proceeding.  Chart review today  discloses mild elevation of hepatic transaminases in June 2024 with AST 41 and ALT 42.  These were drawn in the setting of an annual wellness exam.  Will recommend repeat liver function panel for follow-up.  Plan  Schedule colonoscopy at [facility] with 1 day bowel prep Prescription provided for ondansetron  4 mg ODT tablet 1 tablet every 8 hours as needed for nausea and vomiting in the event she has intolerance with bowel prep. Recommend repeat laboratory studies with CBC, CMP and GGT in future  Planned Follow Up TBD pending colonoscopy and laboratory results  The patient or caregiver verbalized understanding of the material covered, with no barriers to understanding. All questions were answered. Patient or caregiver is agreeable with the plan outlined above.    It was a pleasure to see [place].  If you have any questions or concerns regarding this evaluation, do not hesitate to contact me.  Inocente Hausen, MD [facility]

## 2023-08-12 ENCOUNTER — Ambulatory Visit (INDEPENDENT_AMBULATORY_CARE_PROVIDER_SITE_OTHER): Payer: PRIVATE HEALTH INSURANCE | Admitting: Pediatrics

## 2023-08-12 ENCOUNTER — Encounter: Payer: Self-pay | Admitting: Pediatrics

## 2023-08-12 VITALS — BP 100/80 | HR 76 | Ht 66.25 in | Wt 155.5 lb

## 2023-08-12 DIAGNOSIS — Z800 Family history of malignant neoplasm of digestive organs: Secondary | ICD-10-CM

## 2023-08-12 DIAGNOSIS — R748 Abnormal levels of other serum enzymes: Secondary | ICD-10-CM

## 2023-08-12 DIAGNOSIS — R7401 Elevation of levels of liver transaminase levels: Secondary | ICD-10-CM

## 2023-08-12 DIAGNOSIS — Z83719 Family history of colon polyps, unspecified: Secondary | ICD-10-CM | POA: Diagnosis not present

## 2023-08-12 MED ORDER — NA SULFATE-K SULFATE-MG SULF 17.5-3.13-1.6 GM/177ML PO SOLN: 1.0000 | Freq: Once | ORAL | 0 refills | Status: AC

## 2023-08-12 MED ORDER — ONDANSETRON 4 MG PO TBDP: 4.0000 mg | ORAL_TABLET | Freq: Three times a day (TID) | ORAL | 0 refills | Status: AC | PRN

## 2023-08-12 NOTE — Addendum Note (Signed)
 Addended by: SUZANN MOM on: 08/12/2023 05:23 PM   Modules accepted: Orders

## 2023-08-12 NOTE — Patient Instructions (Signed)
 You have been scheduled for a colonoscopy. Please follow written instructions given to you at your visit today.   If you use inhalers (even only as needed), please bring them with you on the day of your procedure.  DO NOT TAKE 7 DAYS PRIOR TO TEST- Trulicity (dulaglutide) Ozempic, Wegovy (semaglutide) Mounjaro (tirzepatide) Bydureon Bcise (exanatide extended release)  DO NOT TAKE 1 DAY PRIOR TO YOUR TEST Rybelsus (semaglutide) Adlyxin (lixisenatide) Victoza (liraglutide) Byetta (exanatide) ___________________________________________________________________________   Thank you for entrusting me with your care and for choosing [facility], Dr. Inocente Hausen   _______________________________________________________  If your blood pressure at your visit was 140/90 or greater, please contact your primary care physician to follow up on this.  _______________________________________________________  If you are age 29 or older, your body mass index should be between 23-30. Your Body mass index is 24.91 kg/m. If this is out of the aforementioned range listed, please consider follow up with your Primary Care Provider.  If you are age 41 or younger, your body mass index should be between 19-25. Your Body mass index is 24.91 kg/m. If this is out of the aformentioned range listed, please consider follow up with your Primary Care Provider.   ________________________________________________________  The [facility] providers would like to encourage you to use MYCHART to communicate with providers for non-urgent requests or questions.  Due to long hold times on the telephone, sending your provider a message by [facility] may be a faster and more efficient way to get a response.  Please allow 48 business hours for a response.  Please remember that this is for non-urgent requests.  _______________________________________________________

## 2023-09-01 NOTE — Progress Notes (Signed)
 Erroneous encounter This encounter was created in error - please disregard.

## 2023-09-15 ENCOUNTER — Encounter: Payer: Self-pay | Admitting: Pediatrics

## 2023-09-15 NOTE — Telephone Encounter (Signed)
 Patient stated she has new insurance. Requesting a call back to discuss further. Please advise, thank you.

## 2023-09-16 NOTE — Progress Notes (Signed)
 [facility] History and Physical   Primary Care Physician:  Dorina Loving, PA-C   Reason for Procedure:  High risk colorectal cancer screening-family history of colorectal cancer in a first-degree relative and colon polyps in a first-degree relative  Plan:    Colonoscopy     HPI: Carolyn Hill is a 43 y.o. female undergoing colonoscopy for a high risk family history of colon polyps and colorectal cancer.  Patient's father was diagnosed with colorectal cancer in his 75s.  He underwent surgery but did not require chemoradiation and is now doing well in his 86s.  Her older brother had a colonoscopy which revealed 1 colon polyp.  She has never had a colonoscopy.  No current symptoms of rectal bleeding or change in bowel habits.   Past Medical History:  Diagnosis Date   HLD (hyperlipidemia)     Past Surgical History:  Procedure Laterality Date   WISDOM TOOTH EXTRACTION  2004    Prior to Admission medications   Medication Sig Start Date End Date Taking? Authorizing Provider  Cyanocobalamin (VITAMIN B-12 PO) Take 1 tablet by mouth as needed.    [provider]  ondansetron  (ZOFRAN -ODT) 4 MG disintegrating tablet Take 1 tablet (4 mg total) by mouth every 8 (eight) hours as needed for nausea or vomiting. 08/12/23   Suzann Inocente HERO, MD    Current Outpatient Medications  Medication Sig Dispense Refill   Cyanocobalamin (VITAMIN B-12 PO) Take 1 tablet by mouth as needed.     ondansetron  (ZOFRAN -ODT) 4 MG disintegrating tablet Take 1 tablet (4 mg total) by mouth every 8 (eight) hours as needed for nausea or vomiting. 10 tablet 0   Current Facility-Administered Medications  Medication Dose Route Frequency Provider Last Rate Last Admin   0.9 %  sodium chloride  infusion  500 mL Intravenous Once Judye Lorino, Inocente HERO, MD        Allergies as of 09/17/2023   (No Known Allergies)    Family History  Problem Relation Age of Onset   Stroke Mother    Miscarriages / [place]  Mother    Anemia Mother    Colon cancer Father    Colon polyps Father    Fuch's dystrophy Father    Hyperlipidemia Father     Social History   Socioeconomic History   Marital status: Married    Spouse name: Not on file   Number of children: 3   Years of education: Not on file   Highest education level: Not on file  Occupational History   Not on file  Tobacco Use   Smoking status: Never   Smokeless tobacco: Never  Vaping Use   Vaping status: Never Used  Substance and Sexual Activity   Alcohol use: Yes    Comment: 1 glass of wine about 1-2 times a week.   Drug use: No   Sexual activity: Not Currently    Birth control/protection: None  Other Topics Concern   Not on file  Social History Narrative   Not on file   Social Drivers of Health   Financial Resource Strain: Not on file  Food Insecurity: Not on file  Transportation Needs: Not on file  Physical Activity: Not on file  Stress: Not on file  Social Connections: Not on file  Intimate Partner Violence: Not on file    Review of Systems:  All other review of systems negative except as mentioned in the HPI.  Physical Exam: Vital signs BP 116/63   Pulse 70   Temp 98.1  F (36.7 C) (Skin)   Resp 15   Ht 5' 6.25 (1.683 m)   Wt 155 lb (70.3 kg)   SpO2 100%   BMI 24.83 kg/m   General:   Alert,  Well-developed, well-nourished, pleasant and cooperative in NAD Airway:  Mallampati 2 Lungs:  Clear throughout to auscultation.   Heart:  Regular rate and rhythm; no murmurs, clicks, rubs,  or gallops. Abdomen:  Soft, nontender and nondistended. Normal bowel sounds.   Neuro/Psych:  Normal mood and affect. A and O x 3  Inocente Hausen, MD [facility]

## 2023-09-17 ENCOUNTER — Ambulatory Visit: Payer: PRIVATE HEALTH INSURANCE | Admitting: Pediatrics

## 2023-09-17 ENCOUNTER — Encounter: Payer: Self-pay | Admitting: Pediatrics

## 2023-09-17 VITALS — BP 91/59 | HR 59 | Temp 98.1°F | Resp 16 | Ht 66.25 in | Wt 155.0 lb

## 2023-09-17 DIAGNOSIS — Z1211 Encounter for screening for malignant neoplasm of colon: Secondary | ICD-10-CM | POA: Diagnosis present

## 2023-09-17 DIAGNOSIS — Z83719 Family history of colon polyps, unspecified: Secondary | ICD-10-CM

## 2023-09-17 DIAGNOSIS — Z800 Family history of malignant neoplasm of digestive organs: Secondary | ICD-10-CM

## 2023-09-17 DIAGNOSIS — D123 Benign neoplasm of transverse colon: Secondary | ICD-10-CM

## 2023-09-17 DIAGNOSIS — K5730 Diverticulosis of large intestine without perforation or abscess without bleeding: Secondary | ICD-10-CM

## 2023-09-17 DIAGNOSIS — D125 Benign neoplasm of sigmoid colon: Secondary | ICD-10-CM

## 2023-09-17 DIAGNOSIS — D128 Benign neoplasm of rectum: Secondary | ICD-10-CM

## 2023-09-17 DIAGNOSIS — R748 Abnormal levels of other serum enzymes: Secondary | ICD-10-CM

## 2023-09-17 MED ORDER — SODIUM CHLORIDE 0.9 % IV SOLN
500.0000 mL | Freq: Once | INTRAVENOUS | Status: DC
Start: 2023-09-17 — End: 2023-09-17

## 2023-09-17 NOTE — Patient Instructions (Addendum)
 Discharge patient to home (ambulatory).                           - Await pathology results.                           - Repeat colonoscopy for surveillance based on                            pathology results.                           - The findings and recommendations were discussed                            with the patient's family.                           - Patient has a contact number available for                            emergencies. The signs and symptoms of potential                            delayed complications were discussed with the                            patient. Return to normal activities tomorrow.                            Written discharge instructions were provided to the                            patient.  Handout on polyps given.      YOU HAD AN ENDOSCOPIC PROCEDURE TODAY AT THE [facility]:   Refer to the procedure report that was given to you for any specific questions about what was found during the examination.  If the procedure report does not answer your questions, please call your gastroenterologist to clarify.  If you requested that your care partner not be given the details of your procedure findings, then the procedure report has been included in a sealed envelope for you to review at your convenience later.  YOU SHOULD EXPECT: Some feelings of bloating in the abdomen. Passage of more gas than usual.  Walking can help get rid of the air that was put into your GI tract during the procedure and reduce the bloating. If you had a lower endoscopy (such as a colonoscopy or flexible sigmoidoscopy) you may notice spotting of blood in your stool or on the toilet paper. If you underwent a bowel prep for your procedure, you may not have a normal bowel movement for a few days.  Please Note:  You might notice some irritation and congestion in your nose or some drainage.  This is from the oxygen used during your procedure.  There is no need for  concern and it should clear up in a day or so.  SYMPTOMS TO REPORT IMMEDIATELY:  Following lower endoscopy (colonoscopy or flexible sigmoidoscopy):  Excessive amounts of blood in the stool  Significant tenderness or worsening of abdominal pains  Swelling of the abdomen that is new, acute  Fever of 100F or higher   For urgent or emergent issues, a gastroenterologist can be reached at any hour by calling [phone]. Do not use MyChart messaging for urgent concerns.    DIET:  We do recommend a small meal at first, but then you may proceed to your regular diet.  Drink plenty of fluids but you should avoid alcoholic beverages for 24 hours.  ACTIVITY:  You should plan to take it easy for the rest of today and you should NOT DRIVE or use heavy machinery until tomorrow (because of the sedation medicines used during the test).    FOLLOW UP: Our staff will call the number listed on your records the next business day following your procedure.  We will call around 7:15- 8:00 am to check on you and address any questions or concerns that you may have regarding the information given to you following your procedure. If we do not reach you, we will leave a message.     If any biopsies were taken you will be contacted by phone or by letter within the next 1-3 weeks.  Please call us  at [phone] if you have not heard about the biopsies in 3 weeks.    SIGNATURES/CONFIDENTIALITY: You and/or your care partner have signed paperwork which will be entered into your electronic medical record.  These signatures attest to the fact that that the information above on your After Visit Summary has been reviewed and is understood.  Full responsibility of the confidentiality of this discharge information lies with you and/or your care-partner.

## 2023-09-17 NOTE — Progress Notes (Signed)
 Pt's states no medical or surgical changes since previsit or office visit.

## 2023-09-17 NOTE — Progress Notes (Signed)
 Called to room to assist during endoscopic procedure.  Patient ID and intended procedure confirmed with present staff. Received instructions for my participation in the procedure from the performing physician.

## 2023-09-17 NOTE — Op Note (Signed)
 [facility] Patient Name: Carolyn Hill Procedure Date: 09/17/2023 9:09 AM MRN: [id number] Endoscopist: Inocente Hausen , MD, [id number] Age: 43 Referring MD:  Date of Birth: 08/16/1980 Gender: Female Account #: [id number] Procedure:                Colonoscopy Indications:              Colon cancer screening in patient at increased                            risk: Family history of 1st-degree relative with                            colon polyps, Screening in patient at increased                            risk: Family history of 1st-degree relative with                            colorectal cancer before age 71 years, This is the                            patient's first colonoscopy Medicines:                Monitored Anesthesia Care Procedure:                Pre-Anesthesia Assessment:                           - Prior to the procedure, a History and Physical                            was performed, and patient medications and                            allergies were reviewed. The patient's tolerance of                            previous anesthesia was also reviewed. The risks                            and benefits of the procedure and the sedation                            options and risks were discussed with the patient.                            All questions were answered, and informed consent                            was obtained. Prior Anticoagulants: The patient has                            taken no anticoagulant or antiplatelet agents. ASA  Grade Assessment: I - A normal, healthy patient.                            After reviewing the risks and benefits, the patient                            was deemed in satisfactory condition to undergo the                            procedure.                           After obtaining informed consent, the colonoscope                            was passed under direct vision. Throughout the                             procedure, the patient's blood pressure, pulse, and                            oxygen saturations were monitored continuously. The                            Olympus Scope [id number] was introduced through the                            anus and advanced to the cecum, identified by                            appendiceal orifice and ileocecal valve. The                            colonoscopy was performed without difficulty. The                            patient tolerated the procedure well. The quality                            of the bowel preparation was good. The ileocecal                            valve, appendiceal orifice, and rectum were                            photographed. Scope In: 9:14:41 AM Scope Out: 9:37:17 AM Scope Withdrawal Time: 0 hours 13 minutes 57 seconds  Total Procedure Duration: 0 hours 22 minutes 36 seconds  Findings:                 The perianal and digital rectal examinations were                            normal. Pertinent negatives include normal  sphincter tone and no palpable rectal lesions.                           A few large-mouthed diverticula were found in the                            ascending colon and cecum.                           A 7 mm polyp was found in the hepatic flexure. The                            polyp was flat. The polyp was removed with a cold                            snare. Resection and retrieval were complete.                           A 4 mm polyp was found in the sigmoid colon. The                            polyp was sessile. The polyp was removed with a                            cold snare. Resection and retrieval were complete.                           A 10 mm polyp was found in the rectum. The polyp                            was sessile. The polyp was removed with a hot                            snare. Resection and retrieval were complete.                            The retroflexed view of the distal rectum and anal                            verge was normal and showed no anal or rectal                            abnormalities. Complications:            No immediate complications. Estimated blood loss:                            Minimal. Estimated Blood Loss:     Estimated blood loss was minimal. Impression:               - Diverticulosis in the ascending colon and in the  cecum.                           - One 7 mm polyp at the hepatic flexure, removed                            with a cold snare. Resected and retrieved.                           - One 4 mm polyp in the sigmoid colon, removed with                            a cold snare. Resected and retrieved.                           - One 10 mm polyp in the rectum, removed with a hot                            snare. Resected and retrieved.                           - The distal rectum and anal verge are normal on                            retroflexion view. Recommendation:           - Discharge patient to home (ambulatory).                           - Await pathology results.                           - Repeat colonoscopy for surveillance based on                            pathology results.                           - The findings and recommendations were discussed                            with the patient's family.                           - Patient has a contact number available for                            emergencies. The signs and symptoms of potential                            delayed complications were discussed with the                            patient. Return to normal activities tomorrow.  Written discharge instructions were provided to the                            patient. Inocente Hausen, MD 09/17/2023 9:43:41 AM This report has been signed electronically.

## 2023-09-17 NOTE — Progress Notes (Signed)
 Report to PACU, RN, vss, BBS= Clear.

## 2023-09-18 ENCOUNTER — Telehealth: Payer: Self-pay

## 2023-09-18 NOTE — Telephone Encounter (Signed)
 Left message on follow up call.

## 2023-09-19 LAB — SURGICAL PATHOLOGY

## 2023-09-22 ENCOUNTER — Ambulatory Visit: Payer: Self-pay | Admitting: Pediatrics

## 2023-11-18 ENCOUNTER — Telehealth: Payer: Self-pay | Admitting: Pediatrics

## 2023-11-18 NOTE — Progress Notes (Deleted)
 [facility] History and Physical   Primary Care Physician:  Dorina Loving, PA-C   Reason for Procedure:   ***  Plan:    ***     HPI: Carolyn Hill is a 43 y.o. female undergoing***   Past Medical History:  Diagnosis Date   HLD (hyperlipidemia)     Past Surgical History:  Procedure Laterality Date   WISDOM TOOTH EXTRACTION  2004    Prior to Admission medications   Medication Sig Start Date End Date Taking? Authorizing Provider  Cyanocobalamin (VITAMIN B-12 PO) Take 1 tablet by mouth as needed.    [provider]  ondansetron  (ZOFRAN -ODT) 4 MG disintegrating tablet Take 1 tablet (4 mg total) by mouth every 8 (eight) hours as needed for nausea or vomiting. 08/12/23   Suzann Inocente HERO, MD    Current Outpatient Medications  Medication Sig Dispense Refill   Cyanocobalamin (VITAMIN B-12 PO) Take 1 tablet by mouth as needed.     ondansetron  (ZOFRAN -ODT) 4 MG disintegrating tablet Take 1 tablet (4 mg total) by mouth every 8 (eight) hours as needed for nausea or vomiting. 10 tablet 0   No current facility-administered medications for this visit.    Allergies as of 11/19/2023   (No Known Allergies)    Family History  Problem Relation Age of Onset   Stroke Mother    Miscarriages / [place] Mother    Anemia Mother    Colon cancer Father    Colon polyps Father    Fuch's dystrophy Father    Hyperlipidemia Father     Social History   Socioeconomic History   Marital status: Married    Spouse name: Not on file   Number of children: 3   Years of education: Not on file   Highest education level: Not on file  Occupational History   Not on file  Tobacco Use   Smoking status: Never   Smokeless tobacco: Never  Vaping Use   Vaping status: Never Used  Substance and Sexual Activity   Alcohol use: Yes    Comment: 1 glass of wine about 1-2 times a week.   Drug use: No   Sexual activity: Not Currently    Birth control/protection: None  Other Topics  Concern   Not on file  Social History Narrative   Not on file   Social Drivers of Health   Financial Resource Strain: Not on file  Food Insecurity: Not on file  Transportation Needs: Not on file  Physical Activity: Not on file  Stress: Not on file  Social Connections: Not on file  Intimate Partner Violence: Not on file    Review of Systems:  All other review of systems negative except as mentioned in the HPI.  Physical Exam: Vital signs There were no vitals taken for this visit.  General:   Alert,  Well-developed, well-nourished, pleasant and cooperative in NAD Airway:  Mallampati 2 Lungs:  Clear throughout to auscultation.   Heart:  Regular rate and rhythm; no murmurs, clicks, rubs,  or gallops. Abdomen:  Soft, nontender and nondistended. Normal bowel sounds.   Neuro/Psych:  Normal mood and affect. A and O x 3  Inocente Suzann, MD [facility]

## 2023-11-18 NOTE — Progress Notes (Signed)
 [facility] Return Visit   Referring Provider Dorina [place] [facility]IG[facility] [address],  [place] [address]  Primary Care Provider [place], [place], [place]  Patient Profile: Carolyn Hill is a 42 y.o. female who returns to the [facility] office for follow-up of the problem(s) noted below.  Problem List: Personal history of adenomatous colon polyps Family history of colorectal cancer in first-degree relative-father Family history of colon polyps-brother Elevated hepatic transaminases in 2024  History of Present Illness   Carolyn Hill was last seen in the GI office 08/12/2023   Current GI Meds  None  Interval History   Discussed the use of AI scribe software for clinical note transcription with the patient, who gave verbal consent to proceed. d History of Present Illness Carolyn Hill Carolyn Hill is a 43 year old female who presents for follow-up after a colonoscopy with concerns about elevated liver enzymes.  Colorectal polyps and colonoscopy follow-up - Colonoscopy in June 2024 revealed three tubular adenomas; one was >= 1 cm --> 3 year follow-up colonoscopy advised - No gastrointestinal symptoms, including changes in bowel habits, nausea, or vomiting. - Tolerated bowel preparation well without need for antiemetics such as Zofran .   - Her father was diagnosed with colorectal cancer in his 102s; he underwent surgery but did not not require chemoradiation and he is doing well now in his 50s - Her older brother had a colonoscopy which revealed 1 colon polyp  - Reviewed genetic and environmental contributions to the development of colorectal cancer and polyps - Advised avoiding smoking, alcohol as well; recommend Mediterranean diet  Elevated hepatic enzymes - Minimally elevated liver enzymes on previous laboratory testing. - Chart review discloses mild elevation of hepatic transaminases 07/10/2022 with AST 41 and ALT 42 -drawn in the setting of a  wellness exam - No radiographic imaging of liver  - No recent illnesses, excessive alcohol consumption, or history of hepatitis. - Alcohol intake approximately two drinks per month. - No history of intravenous drug use or blood transfusions. - Tattoos performed in a clean environment. - Occasional use of ibuprofen  or acetaminophen  for headaches. - Oral contraceptive use limited to teens and early twenties. - No family history of liver disease.  GI Review of Symptoms Significant for none. Otherwise negative.  General Review of Systems  Review of systems is significant for the pertinent positives and negatives as listed per the HPI.  Full ROS is otherwise negative.  Past Medical History   Past Medical History:  Diagnosis Date   Colon polyp    HLD (hyperlipidemia)      Past Surgical History   Past Surgical History:  Procedure Laterality Date   WISDOM TOOTH EXTRACTION  2004     Allergies and Medications   No Known Allergies    No outpatient medications have been marked as taking for the 11/19/23 encounter (Office Visit) with Shalika Arntz, Inocente HERO, MD.    Family History   Family History  Problem Relation Age of Onset   Stroke Mother    Miscarriages / [place] Mother    Anemia Mother    Colon cancer Father    Colon polyps Father    Fuch's dystrophy Father    Hyperlipidemia Father      Social History   Social History   Tobacco Use   Smoking status: Never   Smokeless tobacco: Never  Vaping Use   Vaping status: Never Used  Substance Use Topics   Alcohol use: Yes    Comment:  1 glass of wine about 1-2 times a week.   Drug use: No   Claudine reports that she has never smoked. She has never used smokeless tobacco. She reports current alcohol use. She reports that she does not use drugs.  Vital Signs and Physical Examination   Vitals:   11/19/23 0829  BP: 112/70  Pulse: 76  Height: 5' 6.25 (1.683 m)  Weight: 156 lb (70.8 kg)  BMI (Calculated): 24.98      General: Well developed, well nourished, no acute distress Head: Normocephalic and atraumatic Eyes: Sclerae anicteric, EOMI Lungs: Clear throughout to auscultation Heart: Regular rate and rhythm; No murmurs, rubs or bruits Abdomen: Soft, non tender and non distended. No masses, hepatosplenomegaly or hernias noted. Normal Bowel sounds Rectal: Deferred Musculoskeletal: Symmetrical with no gross deformities   Review of Data  The following data was reviewed at the time of this encounter:  Laboratory Studies      Latest Ref Rng & Units 07/10/2022   10:54 AM 05/03/2020    7:55 AM 06/08/2016    5:16 AM  CBC  WBC 4.0 - 10.5 K/uL 7.2  6.9  12.8   Hemoglobin 12.0 - 15.0 g/dL 87.7  86.2  89.8   Hematocrit 36.0 - 46.0 % 37.6  39.9  29.5   Platelets 150.0 - 400.0 K/uL 302.0  269.0  183     No results found for: LIPASE    Latest Ref Rng & Units 07/10/2022   10:54 AM 05/03/2020    7:55 AM  CMP  Glucose 70 - 99 mg/dL 95  94   BUN 6 - 23 mg/dL 14  14   Creatinine 9.59 - 1.20 mg/dL 9.28  9.20   Sodium 864 - 145 mEq/L 140  140   Potassium 3.5 - 5.1 mEq/L 4.3  3.9   Chloride 96 - 112 mEq/L 104  104   CO2 19 - 32 mEq/L 24  29   Calcium 8.4 - 10.5 mg/dL 9.4  9.5   Total Protein 6.0 - 8.3 g/dL 7.0  7.1   Total Bilirubin 0.2 - 1.2 mg/dL 0.5  0.7   Alkaline Phos 39 - 117 U/L 86  60   AST 0 - 37 U/L 41  18   ALT 0 - 35 U/L 42  14     Imaging Studies  None  GI Procedures and Studies  Colonoscopy 09/17/2023 - Diverticulosis in the ascending colon and in the cecum.  - One 7 mm polyp at the hepatic flexure, removed with a cold snare. Resected and retrieved.  - One 4 mm polyp in the sigmoid colon, removed with a cold snare. Resected and retrieved.  - One 10 mm polyp in the rectum, removed with a hot snare. Resected and retrieved.  - The distal rectum and anal verge are normal on retroflexion view  Path: tubular adenomas  Clinical Impression  It is my clinical impression that Ms. Flinchbaugh is a  43 y.o. female with;  Personal history of adenomatous colon polyps Family history of colorectal cancer in first-degree relative-father Family history of colon polyps-brother Elevated hepatic transaminases in 2024  Jadelin presents to the office today for follow-up.  She previously underwent her first screening colonoscopy which disclosed 3 tubular adenomas.  A 3-year follow-up colonoscopy interval is advised.  She was also noted to have mildly elevated hepatic transaminases in 2024.  She believes that they was were performed in the setting of a routine annual clinic visit.  She does not recall  having any illness or infections at the time the labs were drawn.  She has no prior history of liver disease and no significant risk factors for liver disease.  No family history of liver problems.  I have recommended repeating her laboratory studies to see if there is evidence of chronicity to the previous liver enzyme elevation.  If her liver enzymes are chronically elevated or fluctuate I have advised an abdominal ultrasound and workup for chronic hepatitides in the future.  Plan  Labs today: CBC, CMP, GGT  If LFTs remain elevated will advise liver ultrasound and workup for chronic hepatitides If LFTs are normal will monitor over time Next surveillance colonoscopy due 09/2026  Planned Follow Up PRN  The patient or caregiver verbalized understanding of the material covered, with no barriers to understanding. All questions were answered. Patient or caregiver is agreeable with the plan outlined above.    It was a pleasure to see [place].  If you have any questions or concerns regarding this evaluation, do not hesitate to contact me.  Inocente Hausen, MD [facility]   I spent total of 30 minutes in both face-to-face (15 minutes interview) and non-face-to-face (15 minutes chart review, care coordination, documentation)  activities, excluding procedures performed, for the visit on the date of this  encounter.

## 2023-11-18 NOTE — Telephone Encounter (Signed)
 Patient requesting to speak with a nurse in regards to her upcoming apt tomorrow. Also inquiring if she needs to have blood work done. Please advise.

## 2023-11-18 NOTE — Telephone Encounter (Signed)
 Spoke with pt. Pt questioned if she needed to have labs done prior. Pt was notified that if the provider requested labs then she could get them done after the office visit. Pt verbalized understanding with all questions answered.

## 2023-11-19 ENCOUNTER — Encounter: Payer: Self-pay | Admitting: Pediatrics

## 2023-11-19 ENCOUNTER — Other Ambulatory Visit (INDEPENDENT_AMBULATORY_CARE_PROVIDER_SITE_OTHER): Payer: Self-pay

## 2023-11-19 ENCOUNTER — Ambulatory Visit (INDEPENDENT_AMBULATORY_CARE_PROVIDER_SITE_OTHER): Payer: Self-pay | Admitting: Pediatrics

## 2023-11-19 ENCOUNTER — Ambulatory Visit: Payer: Self-pay | Admitting: Pediatrics

## 2023-11-19 VITALS — BP 112/70 | HR 76 | Ht 66.25 in | Wt 156.0 lb

## 2023-11-19 DIAGNOSIS — Z860100 Personal history of colon polyps, unspecified: Secondary | ICD-10-CM

## 2023-11-19 DIAGNOSIS — R7989 Other specified abnormal findings of blood chemistry: Secondary | ICD-10-CM

## 2023-11-19 DIAGNOSIS — Z83719 Family history of colon polyps, unspecified: Secondary | ICD-10-CM

## 2023-11-19 DIAGNOSIS — R7401 Elevation of levels of liver transaminase levels: Secondary | ICD-10-CM

## 2023-11-19 DIAGNOSIS — Z800 Family history of malignant neoplasm of digestive organs: Secondary | ICD-10-CM

## 2023-11-19 LAB — COMPREHENSIVE METABOLIC PANEL WITH GFR
ALT: 30 U/L (ref 0–35)
AST: 25 U/L (ref 0–37)
Albumin: 4.5 g/dL (ref 3.5–5.2)
Alkaline Phosphatase: 81 U/L (ref 39–117)
BUN: 12 mg/dL (ref 6–23)
CO2: 28 meq/L (ref 19–32)
Calcium: 9.4 mg/dL (ref 8.4–10.5)
Chloride: 105 meq/L (ref 96–112)
Creatinine, Ser: 0.78 mg/dL (ref 0.40–1.20)
GFR: 94 mL/min (ref >60.00)
Glucose, Bld: 112 mg/dL — ABNORMAL HIGH (ref 70–99)
Potassium: 4.1 meq/L (ref 3.5–5.1)
Sodium: 141 meq/L (ref 135–145)
Total Bilirubin: 0.5 mg/dL (ref 0.2–1.2)
Total Protein: 7.1 g/dL (ref 6.0–8.3)

## 2023-11-19 LAB — CBC WITH DIFFERENTIAL/PLATELET
Basophils Absolute: 0 K/uL (ref 0.0–0.1)
Basophils Relative: 0.7 % (ref 0.0–3.0)
Eosinophils Absolute: 0.1 K/uL (ref 0.0–0.7)
Eosinophils Relative: 1.2 % (ref 0.0–5.0)
HCT: 39.2 % (ref 36.0–46.0)
Hemoglobin: 13.2 g/dL (ref 12.0–15.0)
Lymphocytes Relative: 29.6 % (ref 12.0–46.0)
Lymphs Abs: 1.7 K/uL (ref 0.7–4.0)
MCHC: 33.8 g/dL (ref 30.0–36.0)
MCV: 86.8 fl (ref 78.0–100.0)
Monocytes Absolute: 0.5 K/uL (ref 0.1–1.0)
Monocytes Relative: 7.8 % (ref 3.0–12.0)
Neutro Abs: 3.6 K/uL (ref 1.4–7.7)
Neutrophils Relative %: 60.7 % (ref 43.0–77.0)
Platelets: 304 K/uL (ref 150.0–400.0)
RBC: 4.51 Mil/uL (ref 3.87–5.11)
RDW: 14.8 % (ref 11.5–15.5)
WBC: 5.9 K/uL (ref 4.0–10.5)

## 2023-11-19 LAB — GAMMA GT: GGT: 29 U/L (ref 7–51)

## 2023-11-19 NOTE — Patient Instructions (Signed)
 Your provider has requested that you go to the basement level for lab work before leaving today. Press B on the elevator. The lab is located at the first door on the left as you exit the elevator.  Due to recent changes in healthcare laws, you may see the results of your imaging and laboratory studies on MyChart before your provider has had a chance to review them.  We understand that in some cases there may be results that are confusing or concerning to you. Not all laboratory results come back in the same time frame and the provider may be waiting for multiple results in order to interpret others.  Please give us  48 hours in order for your provider to thoroughly review all the results before contacting the office for clarification of your results.   Follow up as needed.   Thank you for entrusting me with your care and for choosing [facility], Dr. Inocente Hausen   _______________________________________________________  If your blood pressure at your visit was 140/90 or greater, please contact your primary care physician to follow up on this.  _______________________________________________________  If you are age 9 or older, your body mass index should be between 23-30. Your Body mass index is 24.99 kg/m. If this is out of the aforementioned range listed, please consider follow up with your Primary Care Provider.  If you are age 17 or younger, your body mass index should be between 19-25. Your Body mass index is 24.99 kg/m. If this is out of the aformentioned range listed, please consider follow up with your Primary Care Provider.   ________________________________________________________  The [facility] providers would like to encourage you to use MYCHART to communicate with providers for non-urgent requests or questions.  Due to long hold times on the telephone, sending your provider a message by [facility] may be a faster and more efficient way to get a response.  Please allow 48  business hours for a response.  Please remember that this is for non-urgent requests.  _______________________________________________________  [facility] is using a team-based approach to care.  Your team is made up of your doctor and two to three APPS. Our APPS (Nurse Practitioners and Physician Assistants) work with your physician to ensure care continuity for you. They are fully qualified to address your health concerns and develop a treatment plan. They communicate directly with your gastroenterologist to care for you. Seeing the Advanced Practice Practitioners on your physician's team can help you by facilitating care more promptly, often allowing for earlier appointments, access to diagnostic testing, procedures, and other specialty referrals.
